# Patient Record
Sex: Female | Born: 1975 | Race: White | Hispanic: No | Smoking: Never smoker
Health system: Southern US, Community
[De-identification: ages and names within clinical notes are randomized; demographics above are authoritative.]

## PROBLEM LIST (undated history)

## (undated) DIAGNOSIS — F411 Generalized anxiety disorder: Secondary | ICD-10-CM

## (undated) DIAGNOSIS — G4700 Insomnia, unspecified: Secondary | ICD-10-CM

## (undated) DIAGNOSIS — F329 Major depressive disorder, single episode, unspecified: Secondary | ICD-10-CM

## (undated) DIAGNOSIS — J45909 Unspecified asthma, uncomplicated: Secondary | ICD-10-CM

## (undated) DIAGNOSIS — F3289 Other specified depressive episodes: Secondary | ICD-10-CM

## (undated) DIAGNOSIS — R87610 Atypical squamous cells of undetermined significance on cytologic smear of cervix (ASC-US): Secondary | ICD-10-CM

## (undated) DIAGNOSIS — J309 Allergic rhinitis, unspecified: Secondary | ICD-10-CM

## (undated) HISTORY — DX: Generalized anxiety disorder: F41.1

## (undated) HISTORY — PX: ENDOMETRIAL ABLATION: SHX621

## (undated) HISTORY — PX: TUBAL LIGATION: SHX77

## (undated) HISTORY — DX: Other specified depressive episodes: F32.89

## (undated) HISTORY — DX: Atypical squamous cells of undetermined significance on cytologic smear of cervix (ASC-US): R87.610

## (undated) HISTORY — DX: Major depressive disorder, single episode, unspecified: F32.9

## (undated) HISTORY — DX: Allergic rhinitis, unspecified: J30.9

## (undated) HISTORY — DX: Unspecified asthma, uncomplicated: J45.909

## (undated) HISTORY — DX: Insomnia, unspecified: G47.00

---

## 2005-06-08 ENCOUNTER — Encounter: Payer: Self-pay | Admitting: Family Medicine

## 2005-06-08 LAB — CONVERTED CEMR LAB: Pap Smear: NORMAL

## 2005-06-29 ENCOUNTER — Other Ambulatory Visit: Admission: RE | Admit: 2005-06-29 | Discharge: 2005-06-29 | Payer: Self-pay | Admitting: Family Medicine

## 2006-06-30 ENCOUNTER — Ambulatory Visit: Payer: Self-pay | Admitting: Family Medicine

## 2006-08-12 ENCOUNTER — Other Ambulatory Visit: Admission: RE | Admit: 2006-08-12 | Discharge: 2006-08-12 | Payer: Self-pay | Admitting: Family Medicine

## 2007-03-07 ENCOUNTER — Ambulatory Visit (HOSPITAL_COMMUNITY): Admission: RE | Admit: 2007-03-07 | Discharge: 2007-03-07 | Payer: Self-pay | Admitting: Orthopedic Surgery

## 2007-07-07 ENCOUNTER — Telehealth: Payer: Self-pay | Admitting: Family Medicine

## 2007-07-11 ENCOUNTER — Encounter: Payer: Self-pay | Admitting: Family Medicine

## 2007-07-11 DIAGNOSIS — F329 Major depressive disorder, single episode, unspecified: Secondary | ICD-10-CM

## 2007-07-11 DIAGNOSIS — G4700 Insomnia, unspecified: Secondary | ICD-10-CM

## 2007-07-11 DIAGNOSIS — J309 Allergic rhinitis, unspecified: Secondary | ICD-10-CM | POA: Insufficient documentation

## 2007-09-09 ENCOUNTER — Other Ambulatory Visit: Admission: RE | Admit: 2007-09-09 | Discharge: 2007-09-09 | Payer: Self-pay | Admitting: Family Medicine

## 2007-09-15 ENCOUNTER — Encounter: Admission: RE | Admit: 2007-09-15 | Discharge: 2007-09-15 | Payer: Self-pay | Admitting: Occupational Medicine

## 2007-12-13 ENCOUNTER — Ambulatory Visit: Payer: Self-pay | Admitting: Family Medicine

## 2007-12-13 DIAGNOSIS — J329 Chronic sinusitis, unspecified: Secondary | ICD-10-CM | POA: Insufficient documentation

## 2008-10-24 ENCOUNTER — Other Ambulatory Visit: Admission: RE | Admit: 2008-10-24 | Discharge: 2008-10-24 | Payer: Self-pay | Admitting: Family Medicine

## 2009-04-02 ENCOUNTER — Ambulatory Visit (HOSPITAL_COMMUNITY): Admission: RE | Admit: 2009-04-02 | Discharge: 2009-04-02 | Payer: Self-pay | Admitting: Family Medicine

## 2009-05-10 ENCOUNTER — Ambulatory Visit: Payer: Self-pay | Admitting: Family Medicine

## 2009-05-10 DIAGNOSIS — N390 Urinary tract infection, site not specified: Secondary | ICD-10-CM | POA: Insufficient documentation

## 2009-05-10 LAB — CONVERTED CEMR LAB
RBC / HPF: 0
Specific Gravity, Urine: 1.01
pH: 6

## 2009-05-11 ENCOUNTER — Encounter (INDEPENDENT_AMBULATORY_CARE_PROVIDER_SITE_OTHER): Payer: Self-pay | Admitting: Internal Medicine

## 2009-05-14 ENCOUNTER — Ambulatory Visit: Admission: RE | Admit: 2009-05-14 | Discharge: 2009-05-14 | Payer: Self-pay | Admitting: Gynecologic Oncology

## 2009-05-22 ENCOUNTER — Ambulatory Visit: Payer: Self-pay | Admitting: Family Medicine

## 2009-05-22 DIAGNOSIS — R300 Dysuria: Secondary | ICD-10-CM

## 2009-05-22 LAB — CONVERTED CEMR LAB
Bilirubin Urine: NEGATIVE
Blood in Urine, dipstick: NEGATIVE
Glucose, Urine, Semiquant: NEGATIVE
Ketones, urine, test strip: NEGATIVE
Nitrite: NEGATIVE
Protein, U semiquant: NEGATIVE
Specific Gravity, Urine: 1.01
Urobilinogen, UA: 0.2
WBC Urine, dipstick: NEGATIVE
pH: 7

## 2009-06-03 ENCOUNTER — Encounter: Payer: Self-pay | Admitting: Family Medicine

## 2009-06-10 ENCOUNTER — Encounter: Payer: Self-pay | Admitting: Family Medicine

## 2009-08-06 ENCOUNTER — Ambulatory Visit: Payer: Self-pay | Admitting: Family Medicine

## 2009-10-21 ENCOUNTER — Ambulatory Visit (HOSPITAL_COMMUNITY): Admission: RE | Admit: 2009-10-21 | Discharge: 2009-10-21 | Payer: Self-pay | Admitting: Obstetrics and Gynecology

## 2009-11-01 ENCOUNTER — Other Ambulatory Visit: Admission: RE | Admit: 2009-11-01 | Discharge: 2009-11-01 | Payer: Self-pay | Admitting: Family Medicine

## 2010-02-06 ENCOUNTER — Emergency Department (HOSPITAL_COMMUNITY): Admission: EM | Admit: 2010-02-06 | Discharge: 2010-02-06 | Payer: Self-pay | Admitting: Emergency Medicine

## 2010-07-08 NOTE — Assessment & Plan Note (Signed)
Summary: CHECK ON MED AND MAYBE CHANGING/DLO   Vital Signs:  Patient profile:   35 year old female Height:      68 inches Weight:      144.6 pounds BMI:     22.07 Temp:     98.1 degrees F oral Pulse rate:   76 / minute Pulse rhythm:   regular BP sitting:   110 / 70  (left arm) Cuff size:   regular  Vitals Entered By: Benny Lennert CMA Duncan Dull) (August 06, 2009 3:23 PM)  History of Present Illness: Chief complaint review medication  Urinary issues..felt to be due to endometriosis.Marland Kitchensees Dr. Vincente Poli.  HAs been on multiple OCP meds recently.  Depression/anxiety worse in last month Increase in worry. decrease in energy. Fairly good appeytitie. Increase in tearfulness, moody. Poor sleep..uses xanax to sleep. Thoughts race at night. No SI. On zoloft 50 mg x 4 years. Wishes to change given some asspciated weight gain.    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 1 Tablet By Mouth Once A Day 2)  Ambien Cr 12.5 Mg Tbcr (Zolpidem Tartrate) .... Take 1 Tablet By Mouth Every Night As Needed 3)  Albuterol 90 Mcg/act Aers (Albuterol) .... As Needed 4)  Xanax 0.5 Mg Tabs (Alprazolam) .... Take 1 Tablet By Mouth Three Times A Day As Needed 5)  Multivitamins   Tabs (Multiple Vitamin) .... One Tablet Daily 6)  Lybrel 90-20 Mcg Tabs (Levonorgestrel-Ethinyl Estrad) .... Once Daily  Allergies (verified): No Known Drug Allergies  Past History:  Past medical, surgical, family and social histories (including risk factors) reviewed, and no changes noted (except as noted  below).  Past Medical History: Reviewed history from 07/11/2007 and no changes required. PAP SMEAR, ABNORMAL, ASCUS - HIGH RISK HPV (ICD-795.01) INSOMNIA, CHRONIC (ICD-307.42) DEPRESSION (ICD-311) ASTHMA- EXERCISE INDUCED (ICD-493.90) ANXIETY - PANIC ATTACKS (ICD-300.00) ALLERGIC RHINITIS (ICD-477.9)    Family History: Reviewed history from 07/11/2007 and no changes required. Father: Alive 48 healthy Mother: Alive 68, HTN, high chol. Siblings: 1 sister, Crohn's (+) MI < 6  - MGF age 70 CA:  Ovarian PGM, Breast Maternal Aunt age 75, Lung PGF  Social History: Reviewed history from 07/11/2007 and no changes required. Never Smoked Alcohol use-no Drug use-no Regular exercise-yes, walks 4 x/week - 3-5 miles    10 K's Marital Status: Married 3 years, no domestic abuse Children: 1, healthy Occupation: Charity fundraiser (OR Engineer, civil (consulting)) [facility] Diet:  (+) fruits/veggies, no FF, (+) H2O  Physical Exam  General:  Well-developed,well-nourished,in no acute distress; alert,appropriate and cooperative throughout examination Mouth:  Oral mucosa and oropharynx without lesions or exudates.  Teeth in good repair. Neck:  no carotid bruit or thyromegaly no cervical or supraclavicular lymphadenopathy  Lungs:  Normal respiratory effort, chest expands symmetrically. Lungs are clear to auscultation, no crackles or wheezes. Heart:  Normal rate and regular rhythm. S1 and S2 normal without gallop, murmur, click, rub or other extra sounds. Abdomen:  Bowel sounds positive,abdomen soft and non-tender without masses, organomegaly or hernias noted. Pulses:  R and L posterior  tibial pulses are full and equal bilaterally  Extremities:  no edema  Psych:  Oriented X3, normally interactive, subdued, and slightly anxious.     Impression & Recommendations:  Problem # 1:  DEPRESSION (ICD-311) Poor control.. will change to wellbutrin given pt concern of weight issues.  Follow up in 1 month.  The following medications were  removed from the medication list:    Zoloft 50 Mg Tabs (Sertraline hcl) .Marland Kitchen... 1 tablet by mouth once a day Her updated medication list for this problem includes:    Xanax 0.5 Mg Tabs (Alprazolam) .Marland Kitchen... Take 1 tablet by mouth three times a day as needed    Bupropion Hcl 150 Mg Xr24h-tab (Bupropion hcl) .Marland Kitchen... 1 tab by mouth daily  Problem # 2:  ANXIETY - PANIC ATTACKS (ICD-300.00) Uses Xanax as needed..provided by GYN.  The following medications were removed from the medication list:    Zoloft 50 Mg Tabs (Sertraline hcl) .Marland Kitchen... 1 tablet by mouth once a day Her updated medication list for this problem includes:    Xanax 0.5 Mg Tabs (Alprazolam) .Marland Kitchen... Take 1 tablet by mouth three times a day as needed    Bupropion Hcl 150 Mg Xr24h-tab (Bupropion hcl) .Marland Kitchen... 1 tab by mouth daily  Complete Medication List: 1)  Ambien Cr 12.5 Mg Tbcr (Zolpidem tartrate) .... Take 1 tablet by mouth every night as needed 2)  Albuterol 90 Mcg/act Aers (Albuterol) .... As needed 3)  Xanax 0.5 Mg Tabs (Alprazolam) .... Take 1 tablet by mouth three times a day as needed 4)  Multivitamins Tabs (Multiple vitamin) .... One tablet daily 5)  Lybrel 90-20 Mcg Tabs (Levonorgestrel-ethinyl estrad) .... Once daily 6)  Bupropion Hcl 150 Mg Xr24h-tab (Bupropion hcl) .Marland Kitchen.. 1 tab by mouth daily  Patient Instructions: 1)  Follow up in 1 month for depression. Prescriptions: BUPROPION HCL 150 MG XR24H-TAB (BUPROPION HCL) 1 tab by mouth daily  #30 x 11   Entered and Authorized by:   Kerby Nora MD   Signed by:   Kerby Nora MD on 08/06/2009   Method used:   [facility]        [facility]  [address] (retail)       [address]       [place], [place]  [address]       Ph: [phone] or [phone]       Fax: [phone]   RxID:   [id number]   Current Allergies (reviewed today): No known allergies

## 2010-07-08 NOTE — Letter (Signed)
Summary: [facility]   Imported By: Lanelle Bal 06/24/2009 08:54:06  _____________________________________________________________________  External Attachment:    Type:   Image     Comment:   External Document

## 2010-07-08 NOTE — Consult Note (Signed)
Summary: [facility]  [facility]   Imported By: Lanelle Bal 06/24/2009 08:51:30  _____________________________________________________________________  External Attachment:    Type:   Image     Comment:   External Document

## 2010-07-31 ENCOUNTER — Encounter: Payer: Self-pay | Admitting: Family Medicine

## 2010-07-31 ENCOUNTER — Ambulatory Visit (INDEPENDENT_AMBULATORY_CARE_PROVIDER_SITE_OTHER): Payer: Managed Care, Other (non HMO) | Admitting: Family Medicine

## 2010-07-31 DIAGNOSIS — G43009 Migraine without aura, not intractable, without status migrainosus: Secondary | ICD-10-CM

## 2010-08-05 NOTE — Assessment & Plan Note (Signed)
Summary: [id number]   Vital Signs:  Patient profile:   35 year old female Weight:      147.50 pounds BMI:     22.51 Temp:     99.0 degrees F oral Pulse rate:   80 / minute Pulse rhythm:   regular BP sitting:   110 / 78  (left arm) Cuff size:   regular  Vitals Entered By: Selena Batten Dance CMA Duncan Dull) (July 31, 2010 9:31 AM) CC: Migraine   History of Present Illness: CC: migraines  migraines on and off for years.  h/o HA and migraines.  currently with just HA but yesterday had bad migraine with vision changes.  Getting 2-3 migraines every week.  Associated with nausea, photo/phonophobia.  Does get blurry vision and "irritabiliy" aura.  Pain starts in back of neck, then behind both eyes, bilateral.    Not associated with cycle.  h/o endometrial ablation 10/2009, no period since.  h/o BTL.  years ago took imitrex which worked well but had side effects - felt like on fire.  Has been taking too many BC/Excedrin daily.  h/o GAD and OCD, takes zoloft.  family member and coworker have had good success with topamax  Current Medications (verified): 1)  Albuterol 90 Mcg/act Aers (Albuterol) .... As Needed 2)  Xanax 0.5 Mg Tabs (Alprazolam) .... Take 1 Tablet By Mouth Three Times A Day As Needed 3)  Multivitamins   Tabs (Multiple Vitamin) .... One Tablet Daily 4)  Zoloft 100 Mg Tabs (Sertraline Hcl) .Marland Kitchen.. 1 By Mouth Once Daily  Allergies (verified): No Known Drug Allergies  Past History:  Past Medical History: Last updated: 07/11/2007 PAP SMEAR, ABNORMAL, ASCUS - HIGH RISK HPV (ICD-795.01) INSOMNIA, CHRONIC (ICD-307.42) DEPRESSION (ICD-311) ASTHMA- EXERCISE INDUCED (ICD-493.90) ANXIETY - PANIC ATTACKS (ICD-300.00) ALLERGIC RHINITIS (ICD-477.9)    Social History: Never Smoked, no alcohol, no rec drugs caffeine: 1/2 cup/day. Regular exercise-yes, walks 4 x/week - 3-5 miles    10 K's Marital Status: Married 3 years, no domestic abuse Children: 1, healthy Occupation: Charity fundraiser (OR Engineer, civil (consulting))  [facility] [facility] Diet:  (+) fruits/veggies, no FF, (+) H2O  Review of Systems       per HPI  Physical Exam  General:  Well-developed,well-nourished,in no acute distress; alert,appropriate and cooperative throughout examination Head:  Normocephalic and atraumatic without obvious abnormalities. No apparent alopecia or balding. Eyes:  No corneal or conjunctival inflammation noted. EOMI. Perrla.  Ears:  Tms clear  Nose:  nares clear Mouth:  Oral mucosa and oropharynx without lesions or exudates.  Teeth in good repair. Neck:  no carotid bruit or thyromegaly no cervical or supraclavicular lymphadenopathy  Neurologic:  CN 2-12 intact, station and gait intact, sensation intact, strength intact.   Impression & Recommendations:  Problem # 1:  COMMON MIGRAINE (ICD-346.10) neuro nonfocal today.  consistent with migraines discussed treatment of migraines. stop all analgesics, only use excedrin at work if migraine, flexeril at home. Start topamax ppx medication.  discussed side effects including word finding difficutlies, tingling in fingers and dizzienss. imitrex worked but intolerable side effects (body on fire and tinglig).  consider nasal triptan in future keep HA diary, return in 1 mo for f/u.  Complete Medication List: 1)  Albuterol 90 Mcg/act Aers (Albuterol) .... As needed 2)  Xanax 0.5 Mg Tabs (Alprazolam) .... Take 1 tablet by mouth three times a day as needed 3)  Multivitamins Tabs (Multiple vitamin) .... One tablet daily 4)  Zoloft 100 Mg Tabs (Sertraline hcl) .Marland Kitchen.. 1 by mouth once daily 5)  Topamax 50 Mg Tabs (Topiramate) .... One nightly for migraine prevention 6)  Flexeril 5 Mg Tabs (Cyclobenzaprine hcl) .... Take one by mouth two times a day as needed headache, sedation precautions  Patient Instructions: 1)  return to see Dr. Ermalene Searing or myself in 1 month. 2)  Keep headache diary to see if any specific triggers for migraines 3)  Stop all BC and excedrin for now (medication overuse  headache).  May take excedrin at work, but use flexeril at home.  Take at onset of headache. 4)  Start topamax nightly for headache prevention. Prescriptions: FLEXERIL 5 MG TABS (CYCLOBENZAPRINE HCL) take one by mouth two times a day as needed headache, sedation precautions  #30 x 0   Entered and Authorized by:   Eustaquio Boyden  MD   Signed by:   Eustaquio Boyden  MD on 07/31/2010   Method used:   [facility]        [facility]  [address] [facility]       [address]       [place], [place]  [address]       Ph: [phone] or [phone]       Fax: [phone]   RxID:   [id number] TOPAMAX 50 MG TABS (TOPIRAMATE) one nightly for migraine prevention  #30 x 0   Entered and Authorized by:   Eustaquio Boyden  MD   Signed by:   Eustaquio Boyden  MD on 07/31/2010   Method used:   [facility]        [facility]  [id number]       [address]       [place], [place]  [address]       Ph: [phone] or [phone]       Fax: [phone]   RxID:   [id number]    Orders Added: 1)  Est. Patient Level III [20254]    Current Allergies (reviewed today): No known allergies

## 2010-08-25 ENCOUNTER — Encounter: Payer: Self-pay | Admitting: Family Medicine

## 2010-08-26 ENCOUNTER — Ambulatory Visit (INDEPENDENT_AMBULATORY_CARE_PROVIDER_SITE_OTHER): Payer: Managed Care, Other (non HMO) | Admitting: Family Medicine

## 2010-08-26 ENCOUNTER — Encounter: Payer: Self-pay | Admitting: Family Medicine

## 2010-08-26 VITALS — BP 118/70 | HR 76 | Temp 98.6°F | Wt 146.0 lb

## 2010-08-26 DIAGNOSIS — G43009 Migraine without aura, not intractable, without status migrainosus: Secondary | ICD-10-CM

## 2010-08-26 LAB — CBC
HCT: 41.3 % (ref 36.0–46.0)
Hemoglobin: 14.4 g/dL (ref 12.0–15.0)
MCHC: 34.9 g/dL (ref 30.0–36.0)
MCV: 97.7 fL (ref 78.0–100.0)
RBC: 4.23 MIL/uL (ref 3.87–5.11)

## 2010-08-26 LAB — PREGNANCY, URINE: Preg Test, Ur: NEGATIVE

## 2010-08-26 MED ORDER — TOPIRAMATE 50 MG PO TABS: 50.0000 mg | ORAL_TABLET | Freq: Every day | ORAL | Status: DC

## 2010-08-26 MED ORDER — CYCLOBENZAPRINE HCL 5 MG PO TABS: 5.0000 mg | ORAL_TABLET | Freq: Two times a day (BID) | ORAL | Status: DC | PRN

## 2010-08-26 NOTE — Progress Notes (Signed)
  Subjective:    [name] [name] is a 35 y.o. female who presents for follow-up of migraine headaches. Home treatment has included Topamax 50mg  daily with 75% improvement. Headaches are occurring once per week. Generally, the headaches last about 1 hour. Work attendance or other daily activities are affected by the headaches. The patient denies decreased physical activity, dizziness, loss of balance, speech difficulties, vision problems and vomiting in the early morning.  Kept headache diary, has had 3 migraines.  (2/22, 3/1, 3/10).  First one with no sleep (on call).  Other two occurred after pt was in meetings all day.  One of those days had to go home, took flexeril and went to sleep.  Stopped cold [place] analgesics.  No dizziness, paresthesias.  Notes occasional word finding difficulty.  The following portions of the patient's history were reviewed and updated as appropriate: allergies, current medications, past medical history, past social history, past surgical history and problem list.  Review of Systems Pertinent items are noted in HPI.    Objective:    BP 118/70  Pulse 76  Temp(Src) 98.6 F (37 C) (Oral)  Wt 146 lb (66.225 kg) General appearance: alert and cooperative Head: Normocephalic, without obvious abnormality, atraumatic Eyes: conjunctivae/corneas clear. PERRL, EOM's intact. Fundi benign. Throat: lips, mucosa, and tongue normal; teeth and gums normal Neck: no adenopathy and supple, symmetrical, trachea midline Lungs: clear to auscultation bilaterally Heart: regular rate and rhythm, S1, S2 normal, no murmur, click, rub or gallop    Assessment:    Common migraine    Plan:    Continue present treatment and plan. Episodic therapy: flexeril due to low frequency of pain. Prophylactic therapy: Topamax due to high frequency of pain. Side effect profile discussed in detail.  F/u 1-2 months for f/u.  topamax refilled as well as flexeril. S/p BTL

## 2010-08-26 NOTE — Progress Notes (Signed)
Addended by: Eustaquio Boyden on: 08/26/2010 04:49 PM   Modules accepted: Orders, Medications

## 2010-08-26 NOTE — Patient Instructions (Signed)
Good to see you today. Return in 1-2 months for follow up migraines. Continue topamax, flexeril.  (refilled today.) Ensure getting enough rest to further prevent migraines.

## 2010-09-09 LAB — HEPATITIS C ANTIBODY: HCV Ab: NEGATIVE

## 2010-09-09 LAB — GC/CHLAMYDIA PROBE AMP, GENITAL: Chlamydia, DNA Probe: NEGATIVE

## 2010-09-09 LAB — RPR: RPR Ser Ql: NONREACTIVE

## 2010-10-24 NOTE — Assessment & Plan Note (Signed)
[facility]                           [facility] OFFICE NOTE   NAME:[name], [name]                         MRN:          [id number]  DATE:06/30/2006                            DOB:          01-22-1976    CHIEF COMPLAINT:  A 35 year old white female here to establish new  doctor   HISTORY OF PRESENT ILLNESS:  [name] [name] is here to set up with a primary  doctor since moving to [facility]. She was previously cared for by Dr.  Hyacinth Meeker at [facility].   She has the following concerns:  Depression and anxiety: She states that this has been fairly well-  controlled on Zoloft for the past few years. She states that she started  having problems in nursing school, but then her anxiety and panic  attacks worsened after having children. She states that she does not  have any side effects from the Zoloft 50 mg daily. She uses Xanax about  1 time per week for acute panic attacks. She uses Ambien for insomnia 2-  3 times per week. She has been seen at [facility]  previously, about 6 months ago, and states that it did not help much.   REVIEW OF SYSTEMS:  No headache. No dizziness. No hearing problems. No  vision problems. No chest pain or shortness of breath. No nausea,  vomiting or diarrhea, constipation or rectal bleeding. Cold intolerance,  fatigue. No coarse hair. No dry skin.   PAST MEDICAL HISTORY:  1. In 2002, depression.  2. Anxiety and panic attacks.  3. Chronic insomnia.  4. Allergic rhinitis.  5. Exercise-induced asthma.   HOSPITALIZATIONS, SURGERIES, AND PROCEDURES:  Pap smear negative in  January 2007.   ALLERGIES:  None.   MEDICATIONS:  1. Zoloft 50 mg daily.  2. Ambien CR 12.5 mg nightly p.r.n.  3. Xanax 0.5 mg daily p.r.n.  4. Albuterol MDI p.r.n.  5. Multivitamin daily.   SOCIAL HISTORY:  No tobacco use. No alcohol use. No drug use. She works  as an Scientist, forensic at [employer]. She has been married for three  years. She  denies domestic abuse. She has 1 child who is healthy. She runs 4 times  per week about 3 to 5 miles. She and her husband are planning to work up  to doing 10 K's. They currently do 5 K's. She tries to eat fruit and  vegetables, but does not eat much carbohydrates. She avoids fast foods.  She drinks lots of water.   FAMILY HISTORY:  Father alive at age 67, healthy. Mother alive at age 57  with hypertension and high cholesterol. She has a strong family history  of heart attack in her maternal grandfather who had one at age 75. She  has one sister who has Crohn's disease. There is no family history of  diabetes. She has paternal grandmother with ovarian cancer and maternal  aunt who developed breast cancer at age 15. Paternal grandfather with  lung cancer.   PHYSICAL EXAMINATION:  VITAL SIGNS: Height 57 inches, weight 140.2,  making BMI approximately  21. Blood pressure 104/60. Pulse 80.  Temperature 98.0.  GENERAL: Thin-appearing female in no apparent distress.  HEENT: PERRLA. Extra-ocular muscles intact. Oropharynx clear. Tympanic  membranes clear. Nares clear. No thyromegaly. No lymphadenopathy-  supraclavicular or cervical.  CARDIOVASCULAR: Regular rate and rhythm. No murmurs, rubs or gallops.  Normal PMI, 2+ peripheral pulses. No peripheral edema.  LUNGS: Clear to auscultation bilaterally. No wheezes, rales or rhonchi.  ABDOMEN: Soft and nontender. Normoactive bowel sounds. No  hepatosplenomegaly.  MUSCULOSKELETAL: Strength 5/5 in upper and lower extremities.  NEURO: Alert and oriented x3. Cranial nerves II-XII grossly intact.  PSYCH:  No suicidal or homicidal ideation. Appropriate affect. No  hallucinations reported.   ASSESSMENT/PLAN:  1. Depression and anxiety:  I refilled [name] [name]'s Zoloft 50 mg daily      with 6 refills. I will obtain records to verify there is no      problems with her using Xanax or Ambien in the past. We did discuss      these are not great  medications to use chronically. We can always      consider adjusting medication or referral to psychiatry if she      finds that she is using Xanax more frequently.  2. Prevention: She is due for a pap smear and will schedule this. She      is also due for a tetanus vaccine and we will consider doing this      at our next visit. She was congratulated on her frequent exercise.      She was encouraged to make sure she gets a variety of foods in her      diet. She has never had a cholesterol baseline and given her family      history of heart disease we can consider getting one. In addition,      she has some symptoms that could be associated with low thyroid      including depression, fatigue and cold intolerance. We can consider      testing this with her next labs.     Kerby Nora, MD  Electronically Signed    AB/MedQ  DD: 06/30/2006  DT: 06/30/2006  Job #: [id number]

## 2010-10-27 ENCOUNTER — Encounter: Payer: Self-pay | Admitting: Family Medicine

## 2010-10-27 ENCOUNTER — Ambulatory Visit (INDEPENDENT_AMBULATORY_CARE_PROVIDER_SITE_OTHER): Payer: Managed Care, Other (non HMO) | Admitting: Family Medicine

## 2010-10-27 VITALS — BP 120/80 | HR 76 | Temp 98.2°F | Wt 143.8 lb

## 2010-10-27 DIAGNOSIS — J329 Chronic sinusitis, unspecified: Secondary | ICD-10-CM

## 2010-10-27 MED ORDER — AZITHROMYCIN 250 MG PO TABS: ORAL_TABLET | ORAL | Status: AC

## 2010-10-27 NOTE — Progress Notes (Signed)
SUBJECTIVE:  [name] [name] is a 35 y.o. female who complains of coryza, sore throat, swollen glands, post nasal drip, dry cough and fever for 7 days. She denies a history of chest pain, chills, dizziness and vomiting and denies a history of asthma. Patient does not smoke cigarettes.  Symptoms had improved, started taking amoxicillin she had at home 3 days ago.  Last night, symptoms abruptly became worse, spiked temp to 101.   OBJECTIVE: BP 120/80  Pulse 76  Temp(Src) 98.2 F (36.8 C) (Oral)  Wt 143 lb 12.8 oz (65.227 kg)  She appears well, vital signs are as noted. Ears normal.  Throat and pharynx normal.  Neck supple. No adenopathy in the neck. Nose is congested. Sinuses non tender. The chest is clear, without wheezes or rales.  ASSESSMENT:  viral upper respiratory illness and sinusitis  PLAN: Symptomatic therapy suggested: push fluids, rest, gargle warm salt water, return office visit prn if symptoms persist or worsen. Likely viral but given spiked temp on amoxicillin, will treat with Zpack.   Call or return to clinic prn if these symptoms worsen or fail to improve as anticipated.

## 2010-11-19 ENCOUNTER — Other Ambulatory Visit: Payer: Self-pay | Admitting: Family Medicine

## 2010-11-19 ENCOUNTER — Other Ambulatory Visit (HOSPITAL_COMMUNITY)
Admission: RE | Admit: 2010-11-19 | Discharge: 2010-11-19 | Disposition: A | Discharge status: Home / Self Care | Payer: Managed Care, Other (non HMO) | Source: Ambulatory Visit | Attending: Family Medicine | Admitting: Family Medicine

## 2010-11-19 DIAGNOSIS — Z124 Encounter for screening for malignant neoplasm of cervix: Secondary | ICD-10-CM | POA: Insufficient documentation

## 2010-11-19 DIAGNOSIS — R87810 Cervical high risk human papillomavirus (HPV) DNA test positive: Secondary | ICD-10-CM | POA: Insufficient documentation

## 2011-03-27 ENCOUNTER — Encounter (INDEPENDENT_AMBULATORY_CARE_PROVIDER_SITE_OTHER): Payer: Self-pay | Admitting: Surgery

## 2011-03-27 NOTE — Progress Notes (Signed)
This encounter was created in error - please disregard.

## 2011-05-05 ENCOUNTER — Other Ambulatory Visit: Payer: Self-pay | Admitting: Family Medicine

## 2011-05-05 NOTE — Telephone Encounter (Signed)
Ok to refill 

## 2011-11-26 ENCOUNTER — Other Ambulatory Visit: Payer: Self-pay | Admitting: Family Medicine

## 2011-11-26 ENCOUNTER — Other Ambulatory Visit (HOSPITAL_COMMUNITY)
Admission: RE | Admit: 2011-11-26 | Discharge: 2011-11-26 | Disposition: A | Discharge status: Home / Self Care | Payer: Managed Care, Other (non HMO) | Source: Ambulatory Visit | Attending: Family Medicine | Admitting: Family Medicine

## 2011-11-26 DIAGNOSIS — R87810 Cervical high risk human papillomavirus (HPV) DNA test positive: Secondary | ICD-10-CM | POA: Insufficient documentation

## 2011-11-26 DIAGNOSIS — Z124 Encounter for screening for malignant neoplasm of cervix: Secondary | ICD-10-CM | POA: Insufficient documentation

## 2011-12-09 ENCOUNTER — Encounter: Payer: Self-pay | Admitting: Family Medicine

## 2011-12-09 ENCOUNTER — Ambulatory Visit (INDEPENDENT_AMBULATORY_CARE_PROVIDER_SITE_OTHER): Payer: Managed Care, Other (non HMO) | Admitting: Family Medicine

## 2011-12-09 VITALS — BP 120/70 | HR 83 | Temp 98.4°F | Ht 68.0 in | Wt 149.8 lb

## 2011-12-09 DIAGNOSIS — R21 Rash and other nonspecific skin eruption: Secondary | ICD-10-CM

## 2011-12-09 MED ORDER — PREDNISONE 10 MG PO TABS: ORAL_TABLET | ORAL | Status: DC

## 2011-12-09 NOTE — Patient Instructions (Addendum)
Complete course of steroids. Call if rash not improving or changing.  If see large lesion appear (herald patch) .Marland Kitchen Consider Pitaryasis rosea as diagnosis, but no specific treatment. Can take 4-6 weeks to resolve.

## 2011-12-09 NOTE — Progress Notes (Signed)
  Subjective:    Patient ID: [name] [name], female    DOB: 02/04/1976, 36 y.o.   MRN: [id number]  Rash This is a new problem. The current episode started in the past 7 days (after returning from pool.). The affected locations include the abdomen, right axilla, left axilla and torso. The rash is characterized by redness (not itchy). Associated with: went to pool, worse swimming suit, new, used old sunscreen. Pertinent negatives include no anorexia, facial edema, fatigue, fever, joint pain, nail changes, shortness of breath or vomiting. Past treatments include anti-itch cream and topical steroids. The treatment provided no relief. There is no history of allergies, asthma, eczema or varicella.     Review of Systems  Constitutional: Negative for fever and fatigue.  Respiratory: Negative for shortness of breath.   Gastrointestinal: Negative for vomiting and anorexia.  Musculoskeletal: Negative for joint pain.  Skin: Positive for rash. Negative for nail changes.       Objective:   Physical Exam  Constitutional: She appears well-developed and well-nourished.  Neck: Normal range of motion. Neck supple.  Cardiovascular: Normal rate, regular rhythm, normal heart sounds and intact distal pulses.   No murmur heard. Pulmonary/Chest: Effort normal. No respiratory distress. She has no wheezes. She has no rales. She exhibits no tenderness.  Abdominal: Soft. Bowel sounds are normal.  Skin: Skin is warm and dry. Rash noted. Rash is urticarial.          Erythematous raise macule across noted distribution. No oral or hand/foot lesions          Assessment & Plan:

## 2011-12-09 NOTE — Assessment & Plan Note (Signed)
Appears most consistent with irritant dermatitis/hives. Not clearly pitaryasis rosae.. No herald lesion etc. Did discuss this possibliity with pt. Will treat with course of low dose steroid given large distribution difficult to treat with topical. Call if not improving as expected.

## 2011-12-16 ENCOUNTER — Other Ambulatory Visit: Payer: Self-pay | Admitting: Family Medicine

## 2012-04-14 ENCOUNTER — Other Ambulatory Visit: Payer: Self-pay | Admitting: Family Medicine

## 2012-04-14 ENCOUNTER — Other Ambulatory Visit (HOSPITAL_COMMUNITY)
Admission: RE | Admit: 2012-04-14 | Discharge: 2012-04-14 | Disposition: A | Discharge status: Home / Self Care | Payer: Managed Care, Other (non HMO) | Source: Ambulatory Visit | Attending: Family Medicine | Admitting: Family Medicine

## 2012-04-14 DIAGNOSIS — Z124 Encounter for screening for malignant neoplasm of cervix: Secondary | ICD-10-CM | POA: Insufficient documentation

## 2012-09-19 ENCOUNTER — Encounter (HOSPITAL_COMMUNITY): Payer: Self-pay | Admitting: Pharmacy Technician

## 2012-09-28 ENCOUNTER — Encounter (HOSPITAL_COMMUNITY): Payer: Self-pay

## 2012-09-28 ENCOUNTER — Encounter (HOSPITAL_COMMUNITY)
Admission: RE | Admit: 2012-09-28 | Discharge: 2012-09-28 | Disposition: A | Discharge status: Home / Self Care | Payer: Managed Care, Other (non HMO) | Source: Ambulatory Visit | Attending: Obstetrics and Gynecology | Admitting: Obstetrics and Gynecology

## 2012-09-28 LAB — SURGICAL PCR SCREEN: MRSA, PCR: NEGATIVE

## 2012-09-28 LAB — CBC
Hemoglobin: 14.2 g/dL (ref 12.0–15.0)
MCH: 32.1 pg (ref 26.0–34.0)
RBC: 4.43 MIL/uL (ref 3.87–5.11)
WBC: 5.4 10*3/uL (ref 4.0–10.5)

## 2012-09-28 NOTE — Patient Instructions (Addendum)
Your procedure is scheduled on:10/03/12  Enter through the Main Entrance at :6am Pick up desk phone and dial [phone] and inform us of your arrival.  Please call [phone] if you have any problems the morning of surgery.  Remember: Do not eat or drink after midnight:SUNDAY   Take these meds the morning of surgery with a sip of water: Sertraline  DO NOT wear jewelry, eye make-up, lipstick,body lotion, or dark fingernail polish. Do not shave for 48 hours prior to surgery.  If you are to be admitted after surgery, leave suitcase in car until your room has been assigned. Patients discharged on the day of surgery will not be allowed to drive home.

## 2012-10-01 NOTE — H&P (Signed)
[name] [name] is an 37 y.o. female.G2P2 who presents for schedued LAVH/bilateral salpingectomy for ongoing issues with pelvic pain and dysmenorrhea.  The patient has had a long history of pelvic pain and dysmenorrhea with intermittent menorrhagia.  She underwent an endometrial ablation in 2011 and got some relief from her symptoms until about 6 months ago when she began to have cyclical pain again and intermittent bleeding She wants to proceed with definitive surgical therapy at this time.  She has a remote h/o abnormal paps but these have normalized now.  She is s/p BTL and has no further desire for childbearing.    OB History:  NSVD x 2  Menstrual History:  No LMP recorded. Patient is not currently having periods (Reason: Other).    Past Medical History  Diagnosis Date  . Papanicolaou smear of cervix with atypical squamous cells of undetermined significance (ASC-US)   . Persistent disorder of initiating or maintaining sleep   . Depressive disorder, not elsewhere classified   . Unspecified asthma   . Anxiety state, unspecified   . Allergic rhinitis, cause unspecified   . Migraine     Past Surgical History  Procedure Laterality Date  . Tubal ligation    . Endometrial ablation      Family History  Problem Relation Age of Onset  . Hypertension Mother   . Hyperlipidemia Mother   . Crohn's disease Sister   . Heart attack Maternal Grandfather 40  . Cancer Paternal Grandmother     ovarian  . Cancer Maternal Aunt 50    breast  . Cancer Paternal Grandfather     lung    Social History:  reports that she has never smoked. She does not have any smokeless tobacco history on file. She reports that  drinks alcohol. She reports that she does not use illicit drugs.  Allergies:  Allergies  Allergen Reactions  . Dilaudid (Hydromorphone Hcl) Itching    No prescriptions prior to admission    ROS  There were no vitals taken for this visit. Physical Exam  Constitutional: She is  oriented to person, place, and time. She appears well-developed and well-nourished.  Cardiovascular: Normal rate and regular rhythm.   Respiratory: Effort normal and breath sounds normal.  GI: Soft.  Genitourinary: Vagina normal.  Uterus normal size Adnexa without masses  Musculoskeletal: Normal range of motion.  Neurological: She is alert and oriented to person, place, and time.  Psychiatric: She has a normal mood and affect. Her behavior is normal.    No results found for this or any previous visit (from the past 24 hour(s)).  No results found.  Assessment/Plan: The patient was counseled regarding the risks of laparoscopic assisted vaginal hysterectomy, The procedure was reviewed in detail and expectations regarding recovery. Risks of bleeding, infection and possible damage to bowel and bladder were reviewed. The patient understands that should a complication arise she would likely need a larger abdominal incision and that this would delay her recovery. She would accept a blood transfusion if needed. We also discussed removal of the fallopian tubes as a means of possibly reducing future risk of ovarian cancer and she is agreeable to this. She will retain her ovaries unless there is obvious pathology the would warrant removal. She is ready to proceed   [name] [name] 10/01/2012, 8:43 PM

## 2012-10-02 ENCOUNTER — Encounter (HOSPITAL_COMMUNITY): Payer: Self-pay | Admitting: Anesthesiology

## 2012-10-02 NOTE — Anesthesia Preprocedure Evaluation (Addendum)
Anesthesia Evaluation  Patient identified by MRN, date of birth, ID band Patient awake    Reviewed: Allergy & Precautions, H&P , NPO status , Patient's Chart, lab work & pertinent test results  Airway Mallampati: II TM Distance: >3 FB     Dental no notable dental hx. (+) Teeth Intact   Pulmonary asthma ,  breath sounds clear to auscultation  Pulmonary exam normal       Cardiovascular negative cardio ROS  Rhythm:Regular Rate:Normal     Neuro/Psych  Headaches, PSYCHIATRIC DISORDERS Anxiety Depression    GI/Hepatic negative GI ROS, Neg liver ROS,   Endo/Other  negative endocrine ROS  Renal/GU negative Renal ROS   Dysuria    Musculoskeletal negative musculoskeletal ROS (+)   Abdominal   Peds  Hematology negative hematology ROS (+)   Anesthesia Other Findings   Reproductive/Obstetrics Menorrhagia                          Anesthesia Physical Anesthesia Plan  ASA: II  Anesthesia Plan: General   Post-op Pain Management:    Induction: Intravenous  Airway Management Planned: Oral ETT  Additional Equipment:   Intra-op Plan:   Post-operative Plan: Extubation in OR  Informed Consent: I have reviewed the patients History and Physical, chart, labs and discussed the procedure including the risks, benefits and alternatives for the proposed anesthesia with the patient or authorized representative who has indicated his/her understanding and acceptance.   Dental advisory given  Plan Discussed with: CRNA, Anesthesiologist and Surgeon  Anesthesia Plan Comments:         Anesthesia Quick Evaluation

## 2012-10-03 ENCOUNTER — Ambulatory Visit (HOSPITAL_COMMUNITY): Payer: Managed Care, Other (non HMO) | Admitting: Anesthesiology

## 2012-10-03 ENCOUNTER — Encounter (HOSPITAL_COMMUNITY)
Admission: RE | Disposition: A | Discharge status: Home / Self Care | Payer: Self-pay | Source: Ambulatory Visit | Attending: Obstetrics and Gynecology

## 2012-10-03 ENCOUNTER — Encounter (HOSPITAL_COMMUNITY): Payer: Self-pay

## 2012-10-03 ENCOUNTER — Ambulatory Visit (HOSPITAL_COMMUNITY)
Admission: RE | Admit: 2012-10-03 | Discharge: 2012-10-04 | Disposition: A | Discharge status: Home / Self Care | Payer: Managed Care, Other (non HMO) | Source: Ambulatory Visit | Attending: Obstetrics and Gynecology | Admitting: Obstetrics and Gynecology

## 2012-10-03 ENCOUNTER — Encounter (HOSPITAL_COMMUNITY): Payer: Self-pay | Admitting: Anesthesiology

## 2012-10-03 DIAGNOSIS — N949 Unspecified condition associated with female genital organs and menstrual cycle: Secondary | ICD-10-CM | POA: Insufficient documentation

## 2012-10-03 DIAGNOSIS — N838 Other noninflammatory disorders of ovary, fallopian tube and broad ligament: Secondary | ICD-10-CM | POA: Insufficient documentation

## 2012-10-03 DIAGNOSIS — N831 Corpus luteum cyst of ovary, unspecified side: Secondary | ICD-10-CM | POA: Insufficient documentation

## 2012-10-03 DIAGNOSIS — N920 Excessive and frequent menstruation with regular cycle: Secondary | ICD-10-CM | POA: Insufficient documentation

## 2012-10-03 DIAGNOSIS — Z90710 Acquired absence of both cervix and uterus: Secondary | ICD-10-CM | POA: Diagnosis not present

## 2012-10-03 DIAGNOSIS — N946 Dysmenorrhea, unspecified: Secondary | ICD-10-CM | POA: Insufficient documentation

## 2012-10-03 HISTORY — PX: BILATERAL SALPINGECTOMY: SHX5743

## 2012-10-03 HISTORY — PX: LAPAROSCOPIC ASSISTED VAGINAL HYSTERECTOMY: SHX5398

## 2012-10-03 LAB — TYPE AND SCREEN
ABO/RH(D): O POS
Antibody Screen: NEGATIVE

## 2012-10-03 SURGERY — HYSTERECTOMY, VAGINAL, LAPAROSCOPY-ASSISTED
Anesthesia: General | Site: Abdomen | Laterality: Bilateral | Wound class: Clean Contaminated

## 2012-10-03 MED ORDER — CEFAZOLIN SODIUM-DEXTROSE 2-3 GM-% IV SOLR
2.0000 g | INTRAVENOUS | Status: AC
  Administered 2012-10-03: 2 g via INTRAVENOUS

## 2012-10-03 MED ORDER — PHENYLEPHRINE 40 MCG/ML (10ML) SYRINGE FOR IV PUSH (FOR BLOOD PRESSURE SUPPORT)
PREFILLED_SYRINGE | INTRAVENOUS | Status: AC
  Filled 2012-10-03: qty 5

## 2012-10-03 MED ORDER — BUPIVACAINE HCL (PF) 0.25 % IJ SOLN
INTRAMUSCULAR | Status: AC
  Filled 2012-10-03: qty 30

## 2012-10-03 MED ORDER — ONDANSETRON HCL 4 MG/2ML IJ SOLN
INTRAMUSCULAR | Status: DC | PRN
  Administered 2012-10-03: 4 mg via INTRAVENOUS

## 2012-10-03 MED ORDER — VASOPRESSIN 20 UNIT/ML IJ SOLN
INTRAMUSCULAR | Status: AC
  Filled 2012-10-03: qty 1

## 2012-10-03 MED ORDER — GLYCOPYRROLATE 0.2 MG/ML IJ SOLN
INTRAMUSCULAR | Status: AC
  Filled 2012-10-03: qty 3

## 2012-10-03 MED ORDER — MIDAZOLAM HCL 2 MG/2ML IJ SOLN
INTRAMUSCULAR | Status: AC
  Filled 2012-10-03: qty 2

## 2012-10-03 MED ORDER — ZOLPIDEM TARTRATE 5 MG PO TABS
5.0000 mg | ORAL_TABLET | Freq: Every evening | ORAL | Status: DC | PRN
  Administered 2012-10-03: 5 mg via ORAL
  Filled 2012-10-03: qty 1

## 2012-10-03 MED ORDER — MEPERIDINE HCL 25 MG/ML IJ SOLN: 6.2500 mg | INTRAMUSCULAR | Status: DC | PRN

## 2012-10-03 MED ORDER — LACTATED RINGERS IR SOLN
Status: DC | PRN
  Administered 2012-10-03: 3000 mL

## 2012-10-03 MED ORDER — ONDANSETRON HCL 4 MG PO TABS: 4.0000 mg | ORAL_TABLET | Freq: Four times a day (QID) | ORAL | Status: DC | PRN

## 2012-10-03 MED ORDER — IBUPROFEN 600 MG PO TABS
600.0000 mg | ORAL_TABLET | Freq: Four times a day (QID) | ORAL | Status: DC | PRN
  Administered 2012-10-03 – 2012-10-04 (×2): 600 mg via ORAL
  Filled 2012-10-03 (×2): qty 1

## 2012-10-03 MED ORDER — CEFAZOLIN SODIUM-DEXTROSE 2-3 GM-% IV SOLR
INTRAVENOUS | Status: AC
  Filled 2012-10-03: qty 50

## 2012-10-03 MED ORDER — BUPIVACAINE HCL (PF) 0.25 % IJ SOLN
INTRAMUSCULAR | Status: DC | PRN
  Administered 2012-10-03: 12 mL

## 2012-10-03 MED ORDER — DEXTROSE-NACL 5-0.45 % IV SOLN
INTRAVENOUS | Status: DC
  Administered 2012-10-03: 10:00:00 via INTRAVENOUS

## 2012-10-03 MED ORDER — PROPOFOL 10 MG/ML IV EMUL
INTRAVENOUS | Status: DC | PRN
  Administered 2012-10-03: 200 mg via INTRAVENOUS

## 2012-10-03 MED ORDER — VASOPRESSIN 20 UNIT/ML IJ SOLN
INTRAMUSCULAR | Status: AC
Start: 2012-10-03 — End: 2012-10-03
  Filled 2012-10-03: qty 1

## 2012-10-03 MED ORDER — FENTANYL CITRATE 0.05 MG/ML IJ SOLN
25.0000 ug | INTRAMUSCULAR | Status: DC | PRN
  Administered 2012-10-03: 50 ug via INTRAVENOUS

## 2012-10-03 MED ORDER — SCOPOLAMINE 1 MG/3DAYS TD PT72: 1.0000 | MEDICATED_PATCH | TRANSDERMAL | Status: DC

## 2012-10-03 MED ORDER — ESTRADIOL 0.1 MG/GM VA CREA
TOPICAL_CREAM | VAGINAL | Status: AC
  Filled 2012-10-03: qty 42.5

## 2012-10-03 MED ORDER — ACETAMINOPHEN 10 MG/ML IV SOLN
INTRAVENOUS | Status: AC
  Administered 2012-10-03: 1000 mg via INTRAVENOUS
  Filled 2012-10-03: qty 100

## 2012-10-03 MED ORDER — FENTANYL CITRATE 0.05 MG/ML IJ SOLN
INTRAMUSCULAR | Status: AC
  Administered 2012-10-03: 50 ug via INTRAVENOUS
  Filled 2012-10-03: qty 2

## 2012-10-03 MED ORDER — FENTANYL CITRATE 0.05 MG/ML IJ SOLN
INTRAMUSCULAR | Status: AC
  Filled 2012-10-03: qty 2

## 2012-10-03 MED ORDER — ROCURONIUM BROMIDE 100 MG/10ML IV SOLN
INTRAVENOUS | Status: DC | PRN
  Administered 2012-10-03: 50 mg via INTRAVENOUS

## 2012-10-03 MED ORDER — SERTRALINE HCL 100 MG PO TABS
100.0000 mg | ORAL_TABLET | Freq: Every day | ORAL | Status: DC
  Administered 2012-10-04: 100 mg via ORAL
  Filled 2012-10-03: qty 1

## 2012-10-03 MED ORDER — ALBUTEROL SULFATE HFA 108 (90 BASE) MCG/ACT IN AERS
2.0000 | INHALATION_SPRAY | Freq: Four times a day (QID) | RESPIRATORY_TRACT | Status: DC | PRN
  Administered 2012-10-04: 2 via RESPIRATORY_TRACT
  Filled 2012-10-03: qty 6.7

## 2012-10-03 MED ORDER — MEPERIDINE HCL 25 MG/ML IJ SOLN
INTRAMUSCULAR | Status: AC
  Administered 2012-10-03: 12.5 mg via INTRAVENOUS
  Filled 2012-10-03: qty 1

## 2012-10-03 MED ORDER — FENTANYL CITRATE 0.05 MG/ML IJ SOLN
INTRAMUSCULAR | Status: DC | PRN
  Administered 2012-10-03 (×2): 50 ug via INTRAVENOUS
  Administered 2012-10-03: 100 ug via INTRAVENOUS
  Administered 2012-10-03: 50 ug via INTRAVENOUS
  Administered 2012-10-03: 100 ug via INTRAVENOUS

## 2012-10-03 MED ORDER — LACTATED RINGERS IV SOLN: INTRAVENOUS | Status: DC

## 2012-10-03 MED ORDER — KETOROLAC TROMETHAMINE 30 MG/ML IJ SOLN
INTRAMUSCULAR | Status: AC
  Administered 2012-10-03: 30 mg via INTRAVENOUS
  Filled 2012-10-03: qty 1

## 2012-10-03 MED ORDER — NEOSTIGMINE METHYLSULFATE 1 MG/ML IJ SOLN
INTRAMUSCULAR | Status: DC | PRN
  Administered 2012-10-03: 2 mg via INTRAVENOUS

## 2012-10-03 MED ORDER — ALPRAZOLAM 0.5 MG PO TABS
1.0000 mg | ORAL_TABLET | Freq: Two times a day (BID) | ORAL | Status: DC | PRN
  Administered 2012-10-03: 1 mg via ORAL
  Filled 2012-10-03 (×2): qty 1

## 2012-10-03 MED ORDER — NEOSTIGMINE METHYLSULFATE 1 MG/ML IJ SOLN
INTRAMUSCULAR | Status: AC
  Filled 2012-10-03: qty 1

## 2012-10-03 MED ORDER — METOCLOPRAMIDE HCL 5 MG/ML IJ SOLN: 10.0000 mg | Freq: Once | INTRAMUSCULAR | Status: DC | PRN

## 2012-10-03 MED ORDER — ROCURONIUM BROMIDE 50 MG/5ML IV SOLN
INTRAVENOUS | Status: AC
Start: 2012-10-03 — End: 2012-10-03
  Filled 2012-10-03: qty 1

## 2012-10-03 MED ORDER — LIDOCAINE HCL (CARDIAC) 20 MG/ML IV SOLN
INTRAVENOUS | Status: AC
  Filled 2012-10-03: qty 5

## 2012-10-03 MED ORDER — LIDOCAINE HCL (CARDIAC) 20 MG/ML IV SOLN
INTRAVENOUS | Status: DC | PRN
  Administered 2012-10-03: 50 mg via INTRAVENOUS

## 2012-10-03 MED ORDER — MIDAZOLAM HCL 5 MG/5ML IJ SOLN
INTRAMUSCULAR | Status: DC | PRN
  Administered 2012-10-03: 2 mg via INTRAVENOUS

## 2012-10-03 MED ORDER — 0.9 % SODIUM CHLORIDE (POUR BTL) OPTIME
TOPICAL | Status: DC | PRN
  Administered 2012-10-03: 1000 mL

## 2012-10-03 MED ORDER — DOCUSATE SODIUM 100 MG PO CAPS
100.0000 mg | ORAL_CAPSULE | Freq: Two times a day (BID) | ORAL | Status: DC
  Administered 2012-10-03 – 2012-10-04 (×2): 100 mg via ORAL
  Filled 2012-10-03 (×2): qty 1

## 2012-10-03 MED ORDER — ONDANSETRON HCL 4 MG/2ML IJ SOLN: 4.0000 mg | Freq: Four times a day (QID) | INTRAMUSCULAR | Status: DC | PRN

## 2012-10-03 MED ORDER — LACTATED RINGERS IV SOLN
INTRAVENOUS | Status: DC
  Administered 2012-10-03: 125 mL/h via INTRAVENOUS
  Administered 2012-10-03: 08:00:00 via INTRAVENOUS

## 2012-10-03 MED ORDER — VASOPRESSIN 20 UNIT/ML IJ SOLN
INTRAVENOUS | Status: DC | PRN
  Administered 2012-10-03: 08:00:00 via INTRAMUSCULAR

## 2012-10-03 MED ORDER — PHENYLEPHRINE HCL 10 MG/ML IJ SOLN
INTRAMUSCULAR | Status: DC | PRN
  Administered 2012-10-03 (×5): .04 mg via INTRAVENOUS
  Administered 2012-10-03: .08 mg via INTRAVENOUS

## 2012-10-03 MED ORDER — DEXAMETHASONE SODIUM PHOSPHATE 10 MG/ML IJ SOLN
INTRAMUSCULAR | Status: AC
  Filled 2012-10-03: qty 1

## 2012-10-03 MED ORDER — SIMETHICONE 80 MG PO CHEW: 80.0000 mg | CHEWABLE_TABLET | Freq: Four times a day (QID) | ORAL | Status: DC | PRN

## 2012-10-03 MED ORDER — ACETAMINOPHEN 10 MG/ML IV SOLN
1000.0000 mg | Freq: Once | INTRAVENOUS | Status: DC
  Filled 2012-10-03: qty 100

## 2012-10-03 MED ORDER — OXYCODONE-ACETAMINOPHEN 5-325 MG PO TABS
1.0000 | ORAL_TABLET | ORAL | Status: DC | PRN
  Administered 2012-10-03 (×2): 2 via ORAL
  Administered 2012-10-03: 1 via ORAL
  Filled 2012-10-03: qty 2
  Filled 2012-10-03: qty 1
  Filled 2012-10-03: qty 2

## 2012-10-03 MED ORDER — PROPOFOL 10 MG/ML IV EMUL
INTRAVENOUS | Status: AC
  Filled 2012-10-03: qty 20

## 2012-10-03 MED ORDER — KETOROLAC TROMETHAMINE 30 MG/ML IJ SOLN: 30.0000 mg | Freq: Once | INTRAMUSCULAR | Status: AC

## 2012-10-03 MED ORDER — ONDANSETRON HCL 4 MG/2ML IJ SOLN
INTRAMUSCULAR | Status: AC
  Filled 2012-10-03: qty 2

## 2012-10-03 MED ORDER — ALBUTEROL 90 MCG/ACT IN AERS: 2.0000 | INHALATION_SPRAY | Freq: Four times a day (QID) | RESPIRATORY_TRACT | Status: DC | PRN

## 2012-10-03 MED ORDER — MORPHINE SULFATE 4 MG/ML IJ SOLN
1.0000 mg | INTRAMUSCULAR | Status: DC | PRN
  Administered 2012-10-03: 1 mg via INTRAVENOUS
  Filled 2012-10-03: qty 1

## 2012-10-03 MED ORDER — GLYCOPYRROLATE 0.2 MG/ML IJ SOLN
INTRAMUSCULAR | Status: DC | PRN
  Administered 2012-10-03: 0.2 mg via INTRAVENOUS
  Administered 2012-10-03: 0.4 mg via INTRAVENOUS

## 2012-10-03 MED ORDER — FENTANYL CITRATE 0.05 MG/ML IJ SOLN
INTRAMUSCULAR | Status: AC
  Filled 2012-10-03: qty 5

## 2012-10-03 MED ORDER — SCOPOLAMINE 1 MG/3DAYS TD PT72
MEDICATED_PATCH | TRANSDERMAL | Status: AC
  Administered 2012-10-03: 1.5 mg via TRANSDERMAL
  Filled 2012-10-03: qty 1

## 2012-10-03 MED ORDER — DEXAMETHASONE SODIUM PHOSPHATE 4 MG/ML IJ SOLN
INTRAMUSCULAR | Status: DC | PRN
  Administered 2012-10-03: 8 mg via INTRAVENOUS

## 2012-10-03 SURGICAL SUPPLY — 36 items
ADH SKN CLS APL DERMABOND .7 (GAUZE/BANDAGES/DRESSINGS) ×2
CABLE HIGH FREQUENCY MONO STRZ (ELECTRODE) IMPLANT
CATH ROBINSON RED A/P 16FR (CATHETERS) ×1 IMPLANT
CHLORAPREP W/TINT 26ML (MISCELLANEOUS) ×2 IMPLANT
CLOTH BEACON ORANGE TIMEOUT ST (SAFETY) ×2 IMPLANT
CONT PATH 16OZ SNAP LID 3702 (MISCELLANEOUS) ×2 IMPLANT
COVER TABLE BACK 60X90 (DRAPES) ×2 IMPLANT
DECANTER SPIKE VIAL GLASS SM (MISCELLANEOUS) ×3 IMPLANT
DERMABOND ADVANCED (GAUZE/BANDAGES/DRESSINGS) ×2
DERMABOND ADVANCED .7 DNX12 (GAUZE/BANDAGES/DRESSINGS) IMPLANT
ELECT REM PT RETURN 9FT ADLT (ELECTROSURGICAL) ×2
ELECTRODE REM PT RTRN 9FT ADLT (ELECTROSURGICAL) IMPLANT
GLOVE BIO SURGEON STRL SZ 6.5 (GLOVE) ×4 IMPLANT
GLOVE BIOGEL PI IND STRL 6.5 (GLOVE) ×1 IMPLANT
GLOVE BIOGEL PI INDICATOR 6.5 (GLOVE) ×2
GOWN STRL REIN XL XLG (GOWN DISPOSABLE) ×8 IMPLANT
NEEDLE INSUFFLATION 120MM (ENDOMECHANICALS) ×2 IMPLANT
NS IRRIG 1000ML POUR BTL (IV SOLUTION) ×2 IMPLANT
PACK LAVH (CUSTOM PROCEDURE TRAY) ×2 IMPLANT
PROTECTOR NERVE ULNAR (MISCELLANEOUS) ×3 IMPLANT
SCALPEL HARMONIC ACE (MISCELLANEOUS) ×2 IMPLANT
SET IRRIG TUBING LAPAROSCOPIC (IRRIGATION / IRRIGATOR) ×2 IMPLANT
STRIP CLOSURE SKIN 1/4X3 (GAUZE/BANDAGES/DRESSINGS) IMPLANT
SUT SILK 0 FSL (SUTURE) ×2 IMPLANT
SUT VIC AB 0 CT1 18XCR BRD8 (SUTURE) ×3 IMPLANT
SUT VIC AB 0 CT1 8-18 (SUTURE) ×6
SUT VIC AB 2-0 CT1 (SUTURE) ×3 IMPLANT
SUT VICRYL 0 TIES 12 18 (SUTURE) IMPLANT
SUT VICRYL 0 UR6 27IN ABS (SUTURE) IMPLANT
SUT VICRYL 4-0 PS2 18IN ABS (SUTURE) ×2 IMPLANT
TOWEL OR 17X24 6PK STRL BLUE (TOWEL DISPOSABLE) ×5 IMPLANT
TRAY FOLEY CATH 14FR (SET/KITS/TRAYS/PACK) ×2 IMPLANT
TROCAR XCEL NON-BLD 5MMX100MML (ENDOMECHANICALS) ×2 IMPLANT
TROCAR XCEL OPT SLVE 5M 100M (ENDOMECHANICALS) ×4 IMPLANT
WARMER LAPAROSCOPE (MISCELLANEOUS) ×2 IMPLANT
WATER STERILE IRR 1000ML POUR (IV SOLUTION) ×2 IMPLANT

## 2012-10-03 NOTE — Transfer of Care (Signed)
Immediate Anesthesia Transfer of Care Note  Patient: [name] [name]  Procedure(s) Performed: Procedure(s): LAPAROSCOPIC ASSISTED VAGINAL HYSTERECTOMY (Bilateral) BILATERAL SALPINGECTOMY (Bilateral)  Patient Location: PACU  Anesthesia Type:General  Level of Consciousness: awake, alert  and oriented  Airway & Oxygen Therapy: Patient Spontanous Breathing and Patient connected to nasal cannula oxygen  Post-op Assessment: Report given to PACU RN and Post -op Vital signs reviewed and stable  Post vital signs: Reviewed and stable  Complications: No apparent anesthesia complications

## 2012-10-03 NOTE — Progress Notes (Signed)
Patient ID: [name] [name], female   DOB: May 22, 1976, 37 y.o.   MRN: [id number] Per pt no changes in dictated H&P.  Brief exam WNL.

## 2012-10-03 NOTE — Anesthesia Postprocedure Evaluation (Signed)
  Anesthesia Post-op Note  Patient: [name] [name]  Procedure(s) Performed: Procedure(s): LAPAROSCOPIC ASSISTED VAGINAL HYSTERECTOMY (Bilateral) BILATERAL SALPINGECTOMY (Bilateral)  Patient Location: [facility]  Anesthesia Type:General  Level of Consciousness: awake, alert  and oriented  Airway and Oxygen Therapy: Patient Spontanous Breathing and Patient connected to nasal cannula oxygen  Post-op Pain: mild  Post-op Assessment: Post-op Vital signs reviewed and Patient's Cardiovascular Status Stable  Post-op Vital Signs: Reviewed and stable  Complications: No apparent anesthesia complications

## 2012-10-03 NOTE — Op Note (Signed)
Operative report  Preoperative diagnosis Menorrhagia Dysmenorrhea  Postoperative diagnosis Same  Procedure Laparoscopic-assisted vaginal hysterectomy and bilateral salpingectomy  Surgeon Dr. Huel Cote  Assistant Dr. Sherron Monday  Anesthesia Gen.  Findings The ovaries appeared normal bilaterally the left ovary had a corpus luteum cyst. The fallopian tubes had a prior tubal ligation but appeared normal otherwise.  The uterus was normal size and had a slight indentation on the right but no obvious fibroids. There was no endometriosis noted.  Fluids Estimated blood loss 150 cc Urine output 100 cc clear IV fluids 2200 cc LR  Specimen Uterus cervix and bilateral fallopian tubes sent to pathology  Procedure note After informed consent was obtained from the patient she was taken to the operating room where general anesthesia was obtained without difficulty. She was prepped and draped in the normal sterile fashion in the dorsal lithotomy position. An appropriate time out was performed. Attention was then turned to the abdomen where the infraumbilical area was injected with quarter percent Marcaine and a small incision made with the scalpel. The varies needle was then easily introduced into the abdomen and pneumoperitoneum obtained with approximately 3 L of CO2 gas. The 5 mm Optiview trocar was then placed under direct visualization in the umbilical site. The abdomen and pelvis were inspected with findings as previously stated. The other 25 mm ports were then placed in the upper lateral quadrants under direct visualization after injection with quarter percent Marcaine. With the trochars in place the right fallopian tube was elevated and the distal fallopian tube was removed with the harmonic scalpel due to to the previous tubal ligation in the tubing interrupted. This portion was placed in the posterior cul-de-sac. The remainder of the broad ligament and round ligament were then taken  down with harmonic scalpel and the bladder flap created to the midline of the uterus. In a similar fashion the left fallopian tube was elevated and the fimbriated distal end was removed with the harmonic scalpel and placed in the cul-de-sac the remaining broad ligament and round ligament were likewise taken down with the harmonic scalpel and the bladder flap developed to the midline and pushed away from the lower uterine segment and cervix. And all pedicles appeared hemostatic therefore the instruments were removed from the abdomen and the trochars left in place. The abdomen was sterilely draped and attention turned vaginally. The weighted speculum was placed within the vagina cervix identified and grasped with [place] tenaculums x2. The cervix was injected circumferentially with a dilute solution of Pitressin. The Bovie cautery was then utilized to make a circumferential incision around the cervix and this was further developed with the Mayo scissors to dissect the vaginal mucosa off the underlying cervix. The posterior cul-de-sac was entered sharply with Mayo scissors and a banana speculum placed within this incision. The anterior cul-de-sac was then entered sharply as well and the Deaver retractor placed within it. Thus with the rectum and bladder isolated from the uterus the Zeppelin clamps were utilized to clamp the uterosacral ligaments bilaterally. Each side was transected and suture ligated with 0 Vicryl which was held in the hemostat. The remainder of the paracervical tissue broad ligament were taken down sequentially with Zeppelin clamps each step transected and suture ligated with 0 Vicryl. When the uterus could be easily flipped it was and the remaining pedicle was taken down with Zeppelin clamps. The specimen was then handed off to pathology.  The last pedicles were secured with free tie and suture ligature of 0 Vicryl.  Good hemostasis was noted. At this point the banana speculum was removed from the  vagina and the shorter weighted speculum placed within the uterosacral ligaments were reapproximated with a 0 Vicryl suture and the vaginal cuff included with this. Before this was tied down the posterior cuff was run in a running locked suture of 2-0 Vicryl for hemostasis. Once this was completed the uterosacral ligaments were tied together and good support noted. The remaining sutures were trimmed and the vaginal cuff was then closed with 2-0 Vicryl in a running locked fashion. There is no active bleeding noted and all instruments and sponges were removed from the vagina. Gloves were changed and attention was returned to the abdomen were once again pneumoperitoneum was obtained. The pelvis and pedicles were year gaited copiously and no significant active bleeding noted the left pedicle near the ovary was reinforced with harmonic scalpel due to some possible oozing. The ureters were visualized bilaterally and appeared to be normal caliber. The pressure was taken down and no active bleeding noted. Therefore the 5 mm trochars were removed under direct visualization and the pneumoperitoneum evacuated through the umbilical port. Finally the umbilical trocar was removed. All trocar sites were closed with one subcuticular stitch of 4-0 Vicryl and Dermabond. All instrument counts were again correct and the patient was taken to the recovery room in good condition.

## 2012-10-03 NOTE — Brief Op Note (Signed)
10/03/2012  9:11 AM  PATIENT:  [name] [name]  37 y.o. female  PRE-OPERATIVE DIAGNOSIS:  menorrhagia 69629, dysmenorrhea  POST-OPERATIVE DIAGNOSIS:  menorrhagia  PROCEDURE:  Procedure(s): LAPAROSCOPIC ASSISTED VAGINAL HYSTERECTOMY (Bilateral) BILATERAL SALPINGECTOMY (Bilateral)  SURGEON:  Surgeon(s) and Role:    * [name], MD - Primary    * Sherron Monday, MD - Assisting   ANESTHESIA:   general  EBL:  Total I/O In: 1000 [I.V.:1000] Out: 250 [Urine:100; Blood:150]  BLOOD ADMINISTERED:none  DRAINS: Urinary Catheter (Foley)   LOCAL MEDICATIONS USED:  MARCAINE     SPECIMEN:  Uterus, cervix and tubes DISPOSITION OF SPECIMEN:  PATHOLOGY  COUNTS:  YES  TOURNIQUET:  * No tourniquets in log *  DICTATION: .Dragon Dictation  PLAN OF CARE: Admit for overnight observation  PATIENT DISPOSITION:  PACU - hemodynamically stable.

## 2012-10-03 NOTE — Anesthesia Postprocedure Evaluation (Signed)
  Anesthesia Post Note  Patient: [name] [name]  Procedure(s) Performed: Procedure(s) (LRB): LAPAROSCOPIC ASSISTED VAGINAL HYSTERECTOMY (Bilateral) BILATERAL SALPINGECTOMY (Bilateral)  Anesthesia type: GA  Patient location: PACU  Post pain: Pain level controlled  Post assessment: Post-op Vital signs reviewed  Last Vitals:  Filed Vitals:   10/03/12 0945  BP:   Pulse: 83  Temp:   Resp: 18    Post vital signs: Reviewed  Level of consciousness: sedated  Complications: No apparent anesthesia complications

## 2012-10-04 ENCOUNTER — Encounter (HOSPITAL_COMMUNITY): Payer: Self-pay | Admitting: Obstetrics and Gynecology

## 2012-10-04 LAB — CBC
MCH: 31.7 pg (ref 26.0–34.0)
MCHC: 34 g/dL (ref 30.0–36.0)
MCV: 93.3 fL (ref 78.0–100.0)
Platelets: 200 10*3/uL (ref 150–400)
RDW: 12 % (ref 11.5–15.5)

## 2012-10-04 LAB — BASIC METABOLIC PANEL
Calcium: 8.7 mg/dL (ref 8.4–10.5)
Creatinine, Ser: 0.86 mg/dL (ref 0.50–1.10)
GFR calc Af Amer: >90 mL/min (ref >90)
GFR calc non Af Amer: 87 mL/min — ABNORMAL LOW (ref >90)

## 2012-10-04 MED ORDER — IBUPROFEN 600 MG PO TABS: 600.0000 mg | ORAL_TABLET | Freq: Four times a day (QID) | ORAL | Status: DC | PRN

## 2012-10-04 MED ORDER — OXYCODONE-ACETAMINOPHEN 5-325 MG PO TABS: 1.0000 | ORAL_TABLET | ORAL | Status: DC | PRN

## 2012-10-04 NOTE — Discharge Summary (Signed)
Physician Discharge Summary  Patient ID: [name] [name] MRN: [id number] DOB/AGE: 1975/08/08 37 y.o.  Admit date: 10/03/2012 Discharge date: 10/04/2012  Admission Diagnoses:  Menorrhagia                                          Dysmenorrhea  Discharge Diagnoses: same                                         S/p LAVH/Bilateral salpingectomy  Active Problems:   Menorrhagia   Dysmenorrhea   S/P laparoscopic assisted vaginal hysterectomy (LAVH)   Discharged Condition: good  Hospital Course: Pt admitted to observation after LAVH and did well.  Post-op course uneventful. Able to void, ambulate and tolerate po on discharge.   Significant Diagnostic Studies: labs: CBC  Treatments: surgery: LAVH/bilateral salpingectomy  Discharge Exam: Blood pressure 90/49, pulse 62, temperature 98.3 F (36.8 C), temperature source Oral, resp. rate 18, height 5\' 8"  (1.727 m), weight 66.679 kg (147 lb), SpO2 100.00%. General appearance: alert and cooperative Resp: clear to auscultation bilaterally Cardio: regular rate and rhythm GI: soft NT, Incisions well-approximated  Disposition:home     Discharge Orders   Future Orders Complete By Expires     Diet - low sodium heart healthy  As directed     Discharge instructions  As directed     Comments:      Nothing in vagina for 6 weeks. No heavy lifting greater than 10 lbs. Avoid driving for at least 1-2 weeks or until off narcotic pain meds. Shower over incision and pat dry.    Increase activity slowly  As directed         Medication List    TAKE these medications       albuterol 90 MCG/ACT inhaler  Commonly known as:  PROVENTIL,VENTOLIN  Inhale 2 puffs into the lungs every 6 (six) hours as needed.     ALPRAZolam 0.5 MG tablet  Commonly known as:  XANAX  Take 1 mg by mouth 2 (two) times daily as needed for anxiety.     ibuprofen 600 MG tablet  Commonly known as:  ADVIL,MOTRIN  Take 1 tablet (600 mg total) by mouth every 6 (six) hours as needed  (mild pain).     multivitamin tablet  Take 1 tablet by mouth daily.     oxyCODONE-acetaminophen 5-325 MG per tablet  Commonly known as:  PERCOCET/ROXICET  Take 1-2 tablets by mouth every 4 (four) hours as needed.     sertraline 100 MG tablet  Commonly known as:  ZOLOFT  Take 100 mg by mouth daily.       Follow-up Information   Follow up with [name], MD. Schedule an appointment as soon as possible for a visit in 2 weeks. (incision check)    Contact information:   [address], [address] [place] [address] [phone]       Signed: [name] 10/04/2012, 8:13 AM

## 2012-10-04 NOTE — Progress Notes (Signed)
Patient ID: [name] [name], female   DOB: 06-Oct-1975, 37 y.o.   MRN: [id number] Pt doing well.  No c/o Ambulating and tolerating po well. Pain controlled D/c home

## 2012-10-04 NOTE — Progress Notes (Signed)
Pt discharged to home with significant other.  Condition stable.  Pt ambulated to car with Stevphen Meuse, NT.  No equipment for home ordered at discharge.

## 2013-06-05 ENCOUNTER — Ambulatory Visit: Payer: Managed Care, Other (non HMO) | Admitting: Internal Medicine

## 2013-06-05 ENCOUNTER — Telehealth: Payer: Self-pay | Admitting: Family Medicine

## 2013-06-05 NOTE — Telephone Encounter (Signed)
Error

## 2015-07-16 DIAGNOSIS — G4700 Insomnia, unspecified: Secondary | ICD-10-CM | POA: Diagnosis not present

## 2015-07-16 DIAGNOSIS — F419 Anxiety disorder, unspecified: Secondary | ICD-10-CM | POA: Diagnosis not present

## 2015-07-16 DIAGNOSIS — Z13220 Encounter for screening for lipoid disorders: Secondary | ICD-10-CM | POA: Diagnosis not present

## 2015-07-16 DIAGNOSIS — Z131 Encounter for screening for diabetes mellitus: Secondary | ICD-10-CM | POA: Diagnosis not present

## 2015-07-16 DIAGNOSIS — Z0000 Encounter for general adult medical examination without abnormal findings: Secondary | ICD-10-CM | POA: Diagnosis not present

## 2015-07-16 MED FILL — ARIPiprazole 5 MG TABS: 5 | 30 days supply | Qty: 30 | Fill #0

## 2015-07-26 MED FILL — ZOLPIDEM TARTRATE 5 MG TAB: 5 | 15 days supply | Qty: 15 | Fill #0

## 2015-08-07 DIAGNOSIS — G479 Sleep disorder, unspecified: Secondary | ICD-10-CM | POA: Diagnosis not present

## 2015-08-07 DIAGNOSIS — F411 Generalized anxiety disorder: Secondary | ICD-10-CM | POA: Diagnosis not present

## 2015-08-07 MED FILL — ALPRAZolam 1 MG TABS: 1 | 90 days supply | Qty: 180 | Fill #0

## 2015-08-07 MED FILL — ARIPiprazole 5 MG TABS: 5 | 90 days supply | Qty: 90 | Fill #0

## 2015-09-17 ENCOUNTER — Emergency Department (HOSPITAL_COMMUNITY)
Admission: EM | Admit: 2015-09-17 | Discharge: 2015-09-17 | Disposition: A | Discharge status: Home / Self Care | Payer: 59 | Attending: Emergency Medicine | Admitting: Emergency Medicine

## 2015-09-17 ENCOUNTER — Emergency Department (HOSPITAL_COMMUNITY): Payer: 59

## 2015-09-17 ENCOUNTER — Encounter (HOSPITAL_COMMUNITY): Payer: Self-pay | Admitting: Emergency Medicine

## 2015-09-17 DIAGNOSIS — R0789 Other chest pain: Secondary | ICD-10-CM

## 2015-09-17 DIAGNOSIS — R079 Chest pain, unspecified: Secondary | ICD-10-CM | POA: Diagnosis not present

## 2015-09-17 DIAGNOSIS — Y9389 Activity, other specified: Secondary | ICD-10-CM | POA: Diagnosis not present

## 2015-09-17 DIAGNOSIS — Z79899 Other long term (current) drug therapy: Secondary | ICD-10-CM | POA: Diagnosis not present

## 2015-09-17 DIAGNOSIS — F419 Anxiety disorder, unspecified: Secondary | ICD-10-CM | POA: Diagnosis not present

## 2015-09-17 DIAGNOSIS — Y9289 Other specified places as the place of occurrence of the external cause: Secondary | ICD-10-CM | POA: Diagnosis not present

## 2015-09-17 DIAGNOSIS — F329 Major depressive disorder, single episode, unspecified: Secondary | ICD-10-CM | POA: Insufficient documentation

## 2015-09-17 DIAGNOSIS — J45909 Unspecified asthma, uncomplicated: Secondary | ICD-10-CM | POA: Insufficient documentation

## 2015-09-17 DIAGNOSIS — W228XXA Striking against or struck by other objects, initial encounter: Secondary | ICD-10-CM | POA: Insufficient documentation

## 2015-09-17 DIAGNOSIS — S29001A Unspecified injury of muscle and tendon of front wall of thorax, initial encounter: Secondary | ICD-10-CM | POA: Insufficient documentation

## 2015-09-17 DIAGNOSIS — Y998 Other external cause status: Secondary | ICD-10-CM | POA: Insufficient documentation

## 2015-09-17 MED ORDER — HYDROCODONE-ACETAMINOPHEN 5-325 MG PO TABS: 1.0000 | ORAL_TABLET | Freq: Four times a day (QID) | ORAL | Status: AC | PRN

## 2015-09-17 MED ORDER — CYCLOBENZAPRINE HCL 10 MG PO TABS: 10.0000 mg | ORAL_TABLET | Freq: Three times a day (TID) | ORAL | Status: AC | PRN

## 2015-09-17 MED ORDER — IBUPROFEN 600 MG PO TABS: 600.0000 mg | ORAL_TABLET | Freq: Three times a day (TID) | ORAL | Status: AC | PRN

## 2015-09-17 MED FILL — HYDROCODON-APAP 5-325: 5-325 | 3 days supply | Qty: 12 | Fill #0

## 2015-09-17 MED FILL — CYCLOBENZAPRINE 10 MG TAB: 10 | 4 days supply | Qty: 12 | Fill #0

## 2015-09-17 NOTE — ED Provider Notes (Signed)
CSN: [id number]     Arrival date & time 09/17/15  0737 History   First MD Initiated Contact with Patient 09/17/15 0745     Chief Complaint  Patient presents with  . rib cage pain       HPI Patient presents to the emergency department with complaints of left anterior chest and rib pain worse over the past several days.  Pain is moderate to severe in severity and worse with movement and lying back.  She states coughing and sneezing tends to hurt her left anterior chest.  She holds just under her left breast.  She reports that [place] [employer] [place] this [place] and was going over an obstacle and continuously hit her left anterior chest on the top of the wall that she was trying to get over.  She reports initially she didn't have pain but the pain is been persistent since then and seems to be worsening.  No shortness of breath.  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  No other complaints    Past Medical History  Diagnosis Date  . Papanicolaou smear of cervix with atypical squamous cells of undetermined significance (ASC-US)   . Persistent disorder of initiating or maintaining sleep   . Depressive disorder, not elsewhere classified   . Unspecified asthma(493.90)   . Anxiety state, unspecified   . Allergic rhinitis, cause unspecified   . Migraine    Past Surgical History  Procedure Laterality Date  . Tubal ligation    . Endometrial ablation    . Laparoscopic assisted vaginal hysterectomy Bilateral 10/03/2012    Procedure: LAPAROSCOPIC ASSISTED VAGINAL HYSTERECTOMY;  Surgeon: Oliver PilaKathy W Richardson, MD;  Location: [facility];  Service: Gynecology;  Laterality: Bilateral;  . Bilateral salpingectomy Bilateral 10/03/2012    Procedure: BILATERAL SALPINGECTOMY;  Surgeon: Oliver PilaKathy W Richardson, MD;  Location: [facility];  Service: Gynecology;  Laterality: Bilateral;    Family History  Problem Relation Age of Onset  . Hypertension Mother   . Hyperlipidemia Mother   . Crohn's disease Sister   . Heart attack Maternal Grandfather 40  . Cancer Paternal Grandmother     ovarian  . Cancer Maternal Aunt 50    breast  . Cancer Paternal Grandfather     lung   Social History  Substance Use Topics  . Smoking status: Never Smoker   . Smokeless tobacco: None  . Alcohol Use: Yes     Comment: socially   OB History    No data available     Review of Systems  All other systems reviewed and are negative.     Allergies  Dilaudid  Home Medications   Prior to Admission medications   Medication Sig Start Date End Date Taking? Authorizing Provider  albuterol (PROVENTIL,VENTOLIN) 90 MCG/ACT inhaler Inhale 2 puffs into the lungs every 6 (six) hours as needed.      Historical Provider, MD  ALPRAZolam Prudy Feeler(XANAX) 0.5 MG tablet Take 1 mg by mouth 2 (two) times daily as needed for anxiety.     Historical Provider, MD  ibuprofen (ADVIL,MOTRIN) 600 MG tablet Take 1 tablet (600 mg total) by mouth every 6 (six) hours as needed (mild pain). 10/04/12   Huel CoteKathy Richardson, MD  Multiple Vitamin (MULTIVITAMIN) tablet Take 1 tablet by mouth daily.      Historical Provider, MD  oxyCODONE-acetaminophen (  PERCOCET/ROXICET) 5-325 MG per tablet Take 1-2 tablets by mouth every 4 (four) hours as needed. 10/04/12   Huel Cote, MD  sertraline (ZOLOFT) 100 MG tablet Take 100 mg by mouth daily.      Historical Provider, MD   BP 118/84 mmHg  Pulse 78  Temp(Src) 97.9 F (36.6 C) (Oral)  Resp 15  SpO2 99%  LMP 09/24/2012 Physical Exam  Constitutional: She is oriented to person, place, and time. She appears well-developed and well-nourished.  HENT:  Head: Normocephalic.  Eyes: EOM are normal.  Neck: Normal range of motion.  Pulmonary/Chest: Effort normal.  Left anterior chest wall tenderness without bruising or ecchymosis.  Small amount of swelling noted.  Abdominal: She  exhibits no distension. There is no tenderness.  Musculoskeletal: Normal range of motion.  Neurological: She is alert and oriented to person, place, and time.  Psychiatric: She has a normal mood and affect.  Nursing note and vitals reviewed.   ED Course  Procedures (including critical care time) Labs Review Labs Reviewed - No data to display  Imaging Review Dg Chest 2 View  09/17/2015  CLINICAL DATA:  Hit left side ribs climbing over walls 09/14/2015, left side chest pain EXAM: CHEST  2 VIEW COMPARISON:  None. FINDINGS: Cardiomediastinal silhouette is unremarkable. No acute infiltrate or pulmonary edema. No gross fractures are identified. There is no pneumothorax. IMPRESSION: No active cardiopulmonary disease. Electronically Signed   By: Natasha Mead M.D.   On: 09/17/2015 07:58   I have personally reviewed and evaluated these images and lab results as part of my medical decision-making.   EKG Interpretation None      MDM   Final diagnoses:  None    Likely anterior rib contusion.  Doubt ACS.  Doubt PE.  Chest x-ray without abnormality.  Patient be treated with also relaxants, anti-inflammatories, short course of hydrocodone.  Overall well-appearing.    Azalia Bilis, MD 09/17/15 [phone]

## 2015-09-17 NOTE — ED Notes (Signed)
Patient transported to X-ray 

## 2015-09-17 NOTE — ED Notes (Signed)
Discharge instructions, follow up care, and rx x3 reviewed with patient. Patient verbalized understanding. 

## 2015-09-17 NOTE — ED Notes (Signed)
MD at bedside. 

## 2015-09-17 NOTE — Discharge Instructions (Signed)
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
## 2015-10-14 MED FILL — SERTRALINE HCL 100 MG TAB: 100 | 90 days supply | Qty: 90 | Fill #0

## 2015-10-15 ENCOUNTER — Encounter (HOSPITAL_COMMUNITY): Payer: Self-pay | Admitting: *Deleted

## 2015-10-15 ENCOUNTER — Ambulatory Visit (HOSPITAL_COMMUNITY)
Admission: EM | Admit: 2015-10-15 | Discharge: 2015-10-15 | Disposition: A | Discharge status: Home / Self Care | Payer: 59 | Attending: Family Medicine | Admitting: Family Medicine

## 2015-10-15 DIAGNOSIS — J302 Other seasonal allergic rhinitis: Secondary | ICD-10-CM | POA: Diagnosis not present

## 2015-10-15 MED ORDER — METHYLPREDNISOLONE ACETATE 40 MG/ML IJ SUSP
80.0000 mg | Freq: Once | INTRAMUSCULAR | Status: AC
  Administered 2015-10-15: 80 mg via INTRAMUSCULAR

## 2015-10-15 MED ORDER — METHYLPREDNISOLONE ACETATE 80 MG/ML IJ SUSP
INTRAMUSCULAR | Status: AC
  Filled 2015-10-15: qty 1

## 2015-10-15 MED ORDER — MONTELUKAST SODIUM 10 MG PO TABS: 10.0000 mg | ORAL_TABLET | Freq: Every day | ORAL | Status: AC

## 2015-10-15 MED ORDER — TRIAMCINOLONE ACETONIDE 40 MG/ML IJ SUSP
INTRAMUSCULAR | Status: AC
  Filled 2015-10-15: qty 1

## 2015-10-15 MED ORDER — FLUTICASONE PROPIONATE 50 MCG/ACT NA SUSP: 1.0000 | Freq: Two times a day (BID) | NASAL | Status: DC

## 2015-10-15 MED ORDER — TRIAMCINOLONE ACETONIDE 40 MG/ML IJ SUSP
40.0000 mg | Freq: Once | INTRAMUSCULAR | Status: AC
  Administered 2015-10-15: 40 mg via INTRAMUSCULAR

## 2015-10-15 NOTE — ED Provider Notes (Signed)
CSN: [id number]     Arrival date & time 10/15/15  1312 History   First MD Initiated Contact with Patient 10/15/15 1407     Chief Complaint  Patient presents with  . URI   (Consider location/radiation/quality/duration/timing/severity/associated sxs/prior Treatment) Patient is a 40 y.o. female presenting with URI. The history is provided by the patient.  URI Presenting symptoms: congestion, cough, ear pain, rhinorrhea and sore throat   Presenting symptoms: no fever   Severity:  Moderate Onset quality:  Gradual Duration:  1 month Progression:  Worsening Chronicity:  New Relieved by:  Nothing Ineffective treatments:  Nebulizer treatments Associated symptoms: sneezing   Associated symptoms: no myalgias, no sinus pain and no wheezing   Risk factors: no sick contacts     Past Medical History  Diagnosis Date  . Papanicolaou smear of cervix with atypical squamous cells of undetermined significance (ASC-US)   . Persistent disorder of initiating or maintaining sleep   . Depressive disorder, not elsewhere classified   . Unspecified asthma(493.90)   . Anxiety state, unspecified   . Allergic rhinitis, cause unspecified   . Migraine    Past Surgical History  Procedure Laterality Date  . Tubal ligation    . Endometrial ablation    . Laparoscopic assisted vaginal hysterectomy Bilateral 10/03/2012    Procedure: LAPAROSCOPIC ASSISTED VAGINAL HYSTERECTOMY;  Surgeon: Oliver PilaKathy W Richardson, MD;  Location: [facility];  Service: Gynecology;  Laterality: Bilateral;  . Bilateral salpingectomy Bilateral 10/03/2012    Procedure: BILATERAL SALPINGECTOMY;  Surgeon: Oliver PilaKathy W Richardson, MD;  Location: [facility];  Service: Gynecology;  Laterality: Bilateral;   Family History  Problem Relation Age of Onset  . Hypertension Mother   . Hyperlipidemia Mother   . Crohn's disease Sister   . Heart attack Maternal Grandfather 40  . Cancer Paternal Grandmother     ovarian  . Cancer Maternal Aunt 50    breast  . Cancer  Paternal Grandfather     lung   Social History  Substance Use Topics  . Smoking status: Never Smoker   . Smokeless tobacco: None  . Alcohol Use: Yes     Comment: socially   OB History    No data available     Review of Systems  Constitutional: Negative.  Negative for fever.  HENT: Positive for congestion, ear pain, rhinorrhea, sneezing and sore throat.   Respiratory: Positive for cough. Negative for shortness of breath and wheezing.   Musculoskeletal: Negative for myalgias.  All other systems reviewed and are negative.   Allergies  Dilaudid  Home Medications   Prior to Admission medications   Medication Sig Start Date End Date Taking? Authorizing Provider  albuterol (PROVENTIL,VENTOLIN) 90 MCG/ACT inhaler Inhale 2 puffs into the lungs every 6 (six) hours as needed.      Historical Provider, MD  ALPRAZolam Prudy Feeler(XANAX) 0.5 MG tablet Take 1 mg by mouth 2 (two) times daily as needed for anxiety.     Historical Provider, MD  cyclobenzaprine (FLEXERIL) 10 MG tablet Take 1 tablet (10 mg total) by mouth 3 (three) times daily as needed for muscle spasms. 09/17/15   Azalia BilisKevin Campos, MD  fluticasone [facility](FLONASE) 50 MCG/ACT nasal spray Place 1 spray into both nostrils 2 (two) times daily. 10/15/15   Linna HoffJames D Kindl, MD  HYDROcodone-acetaminophen (NORCO/VICODIN) 5-325 MG tablet Take 1 tablet by mouth every 6 (six) hours as needed for moderate pain. 09/17/15   Azalia BilisKevin Campos, MD  ibuprofen (ADVIL,MOTRIN) 600 MG tablet Take 1 tablet (600 mg total) by  mouth every 8 (eight) hours as needed. 09/17/15   Azalia Bilis, MD  montelukast (SINGULAIR) 10 MG tablet Take 1 tablet (10 mg total) by mouth at bedtime. 10/15/15   Linna Hoff, MD  Multiple Vitamin (MULTIVITAMIN) tablet Take 1 tablet by mouth daily.      Historical Provider, MD  sertraline (ZOLOFT) 100 MG tablet Take 100 mg by mouth daily.      Historical Provider, MD   Meds Ordered and Administered this Visit   Medications  methylPREDNISolone acetate  (DEPO-MEDROL) injection 80 mg (80 mg Intramuscular Given 10/15/15 1427)  triamcinolone acetonide (KENALOG-40) injection 40 mg (40 mg Intramuscular Given 10/15/15 1427)    BP 122/74 mmHg  Pulse 72  Temp(Src) 98.6 F (37 C) (Oral)  SpO2 98%  LMP 09/24/2012 No data found.   Physical Exam  Constitutional: She is oriented to person, place, and time. She appears well-developed and well-nourished. No distress.  HENT:  Right Ear: External ear normal.  Left Ear: External ear normal.  Nose: Mucosal edema and rhinorrhea present.  Mouth/Throat: Oropharynx is clear and moist.  Neck: Normal range of motion. Neck supple.  Cardiovascular: Normal rate, regular rhythm, normal heart sounds and intact distal pulses.   Pulmonary/Chest: Effort normal and breath sounds normal.  Lymphadenopathy:    She has no cervical adenopathy.  Neurological: She is alert and oriented to person, place, and time.  Skin: Skin is warm and dry.  Nursing note and vitals reviewed.   ED Course  Procedures (including critical care time)  Labs Review Labs Reviewed - No data to display  Imaging Review No results found.   Visual Acuity Review  Right Eye Distance:   Left Eye Distance:   Bilateral Distance:    Right Eye Near:   Left Eye Near:    Bilateral Near:         MDM   1. Seasonal allergic reaction    Meds ordered this encounter  Medications  . methylPREDNISolone acetate (DEPO-MEDROL) injection 80 mg    Sig:   . triamcinolone acetonide (KENALOG-40) injection 40 mg    Sig:   . montelukast (SINGULAIR) 10 MG tablet    Sig: Take 1 tablet (10 mg total) by mouth at bedtime.    Dispense:  30 tablet    Refill:  1  . fluticasone (FLONASE) 50 MCG/ACT nasal spray    Sig: Place 1 spray into both nostrils 2 (two) times daily.    Dispense:  1 g    Refill:  2       Linna Hoff, MD 10/15/15 1428

## 2015-10-15 NOTE — ED Notes (Signed)
Pt  Reports   Symptoms  Of   Cough   Congested        sorethroat          With      r  Earache           The   Uri  Symptoms  Have been  Present  For  About  1  Month    And  Not getting better  And  The  Earache  Has  Been  Present  For  sev  Days     the  Pt     Pt  Reports  The  Symptoms  Have  Not  Been releived  By otc meds

## 2015-11-06 MED FILL — ALPRAZolam 1 MG TABS: 1 | 90 days supply | Qty: 180 | Fill #0

## 2016-01-20 MED FILL — SERTRALINE HCL 100 MG TAB: 100 | 90 days supply | Qty: 90 | Fill #1

## 2016-02-11 MED FILL — ALPRAZolam 1 MG TABS: 1 | 90 days supply | Qty: 180 | Fill #0

## 2016-03-02 DIAGNOSIS — F419 Anxiety disorder, unspecified: Secondary | ICD-10-CM | POA: Diagnosis not present

## 2016-03-02 DIAGNOSIS — G4700 Insomnia, unspecified: Secondary | ICD-10-CM | POA: Diagnosis not present

## 2016-03-02 MED FILL — lamoTRIgine 25 MG TABS: 25 | 30 days supply | Qty: 60 | Fill #0

## 2016-04-20 MED FILL — lamoTRIgine 25 MG TABS: 25 | 30 days supply | Qty: 60 | Fill #0

## 2016-05-11 MED FILL — ALPRAZolam 1 MG TABS: 1 | 90 days supply | Qty: 180 | Fill #0

## 2016-06-04 MED FILL — lamoTRIgine 25 MG TABS: 25 | 37 days supply | Qty: 60 | Fill #0

## 2016-06-04 MED FILL — SERTRALINE HCL 100 MG TAB: 100 | 90 days supply | Qty: 90 | Fill #0

## 2016-06-06 DIAGNOSIS — J0300 Acute streptococcal tonsillitis, unspecified: Secondary | ICD-10-CM | POA: Diagnosis not present

## 2016-06-06 DIAGNOSIS — J029 Acute pharyngitis, unspecified: Secondary | ICD-10-CM | POA: Diagnosis not present

## 2016-08-29 IMAGING — CR DG CHEST 2V
2 series · 2 of 2 positions shown · non-contrast
Comparison: None.

CLINICAL DATA: Hit left side ribs climbing over walls 09/14/2015,
left side chest pain

EXAM:
CHEST  2 VIEW

[w chest pa]
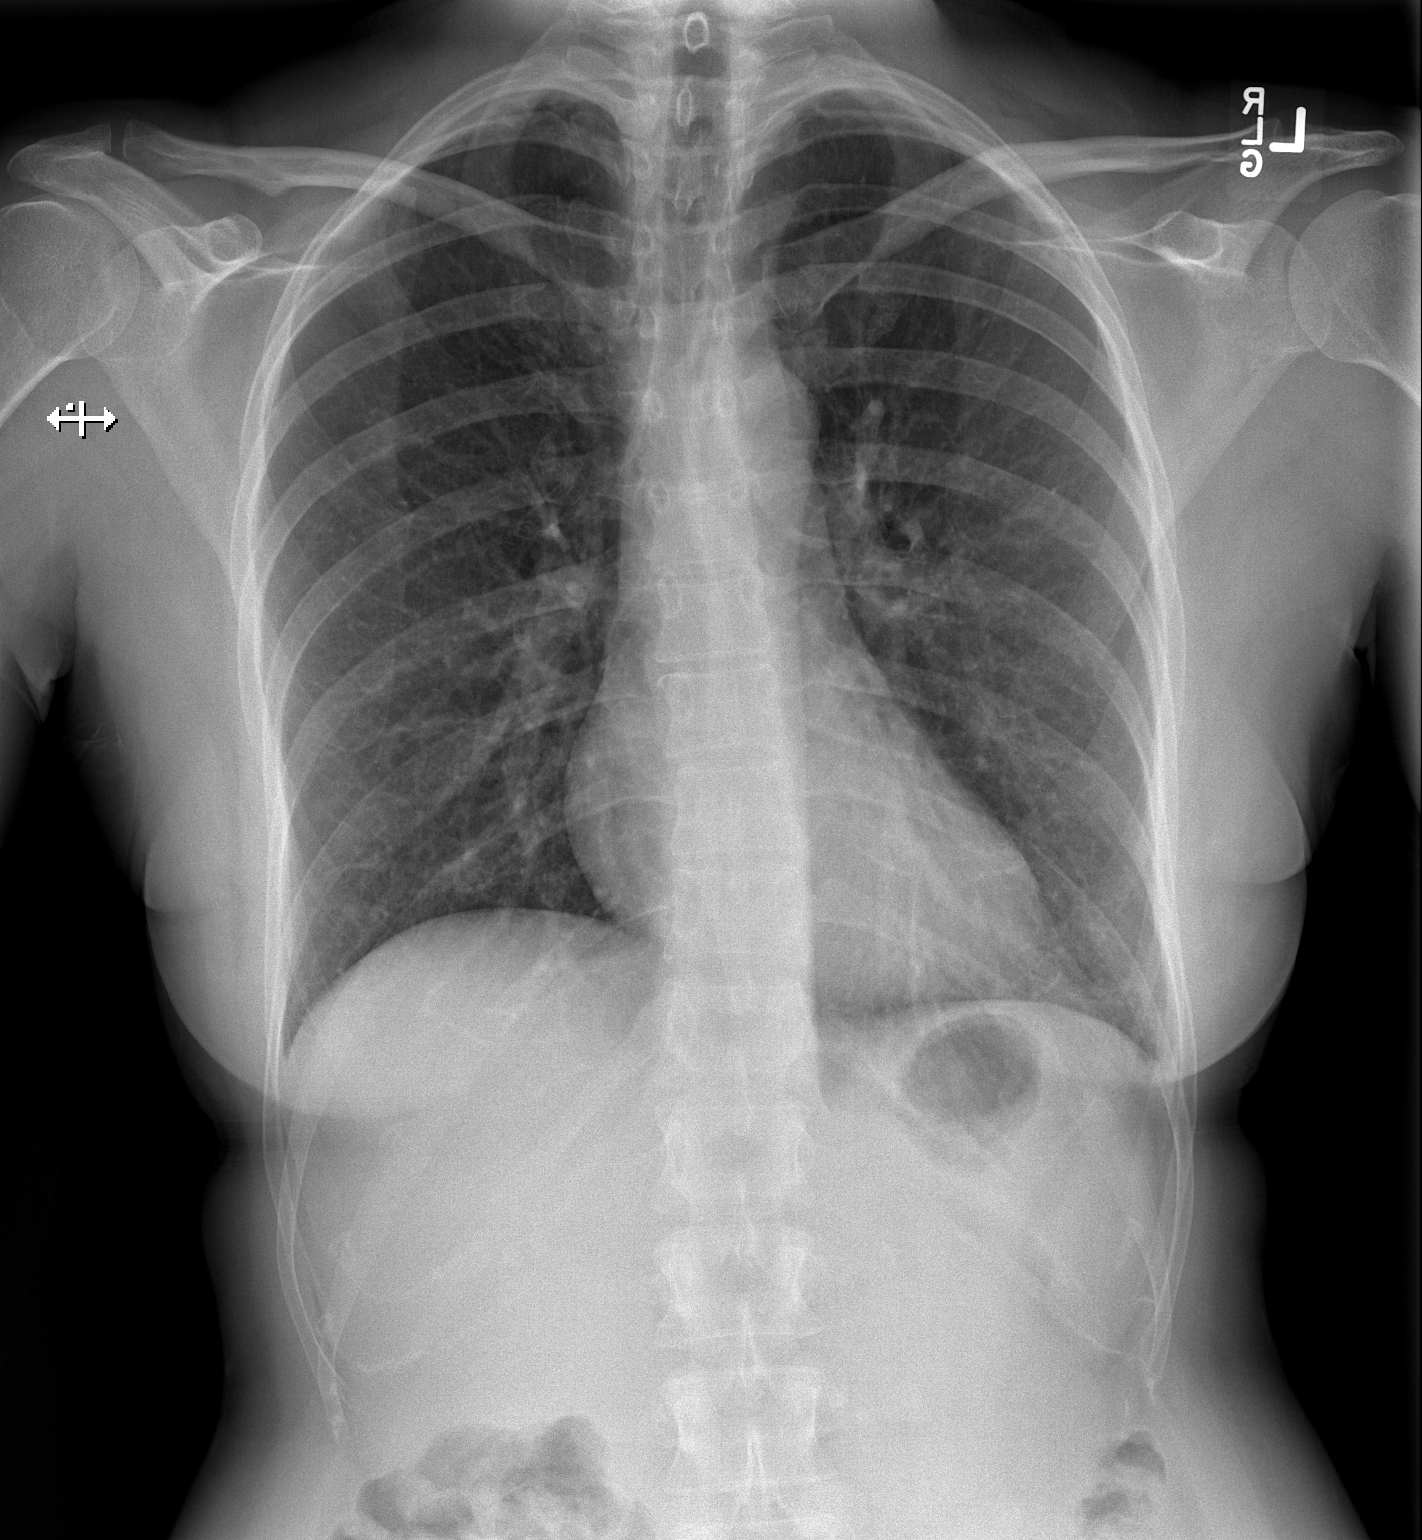

[w chest lat]
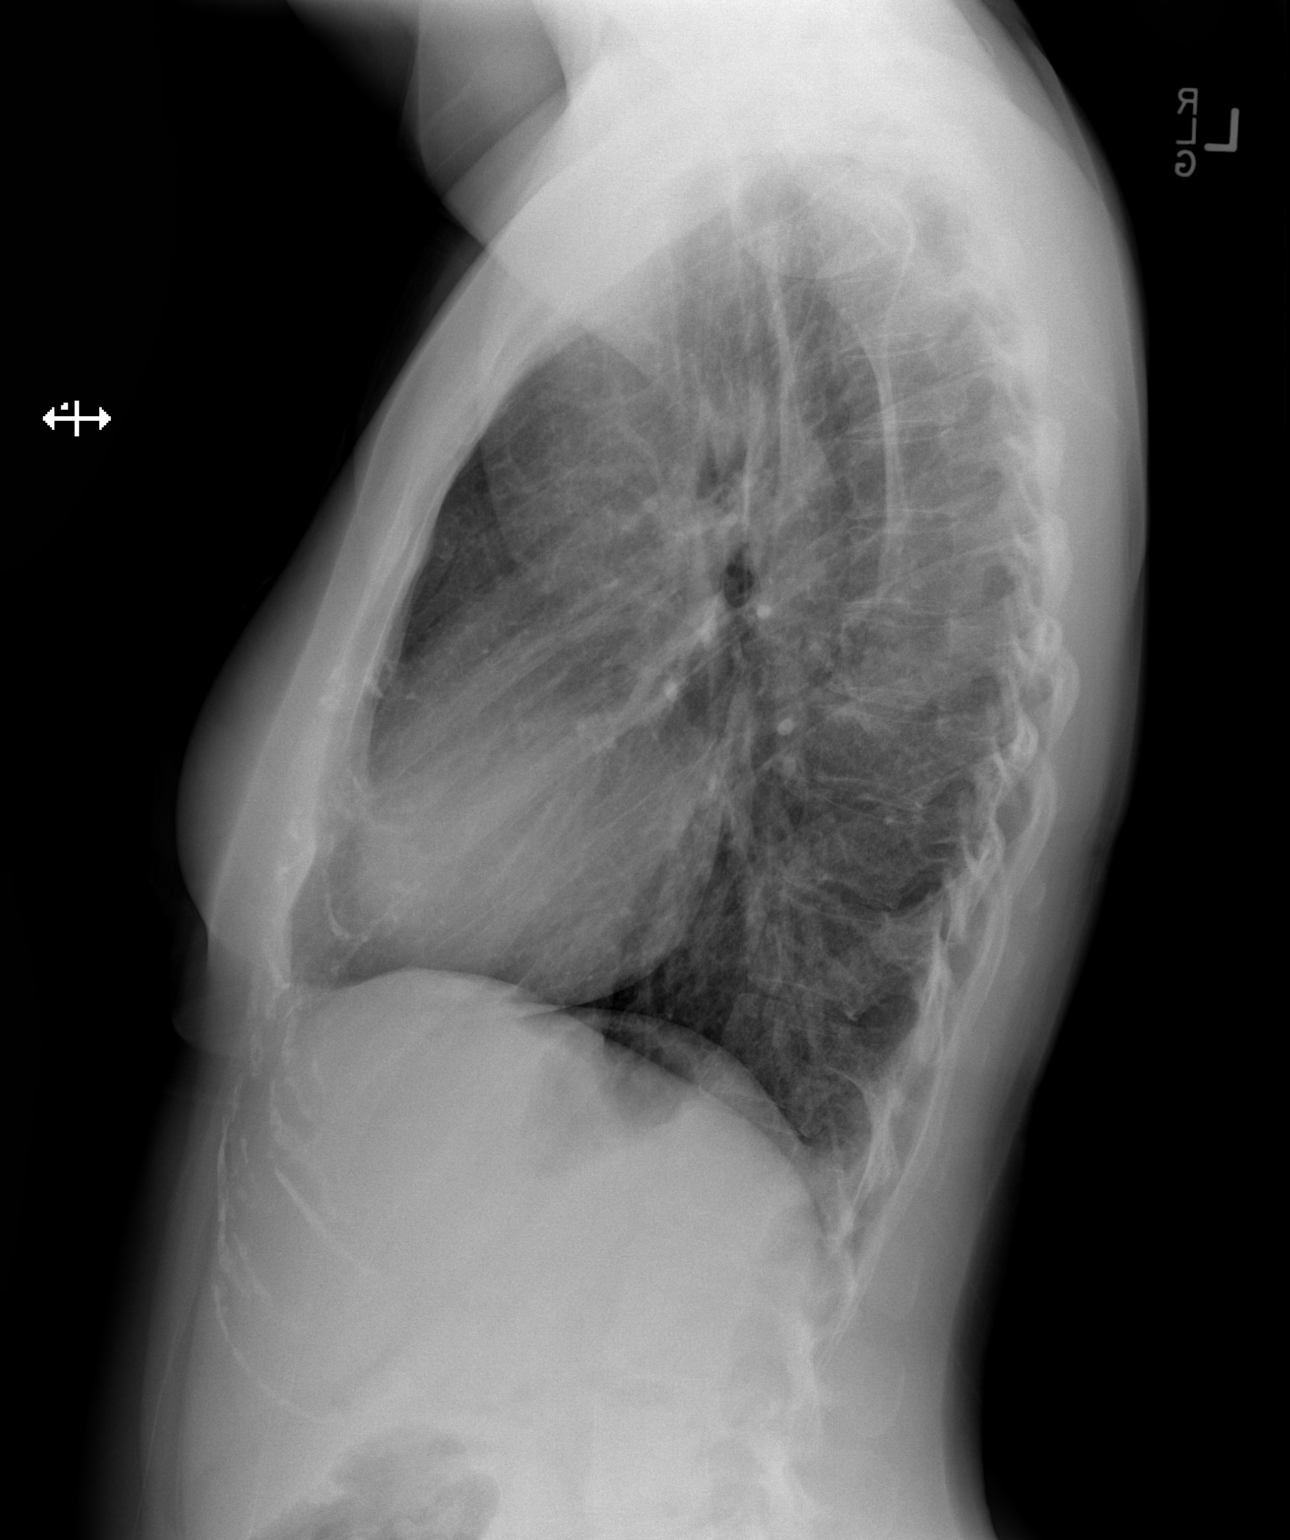

[2 of 2 positions shown; findings below may reference images not displayed]

FINDINGS: Cardiomediastinal silhouette is unremarkable. No acute infiltrate or
pulmonary edema. No gross fractures are identified. There is no
pneumothorax.
IMPRESSION: No active cardiopulmonary disease.

## 2022-02-19 DIAGNOSIS — F419 Anxiety disorder, unspecified: Secondary | ICD-10-CM | POA: Diagnosis not present

## 2022-02-19 DIAGNOSIS — R41840 Attention and concentration deficit: Secondary | ICD-10-CM | POA: Diagnosis not present

## 2022-03-30 DIAGNOSIS — G5622 Lesion of ulnar nerve, left upper limb: Secondary | ICD-10-CM | POA: Diagnosis not present

## 2022-03-30 DIAGNOSIS — S5002XA Contusion of left elbow, initial encounter: Secondary | ICD-10-CM | POA: Diagnosis not present

## 2022-04-15 DIAGNOSIS — M25522 Pain in left elbow: Secondary | ICD-10-CM | POA: Diagnosis not present

## 2022-05-07 DIAGNOSIS — S5002XD Contusion of left elbow, subsequent encounter: Secondary | ICD-10-CM | POA: Diagnosis not present

## 2022-05-14 DIAGNOSIS — Z0000 Encounter for general adult medical examination without abnormal findings: Secondary | ICD-10-CM | POA: Diagnosis not present

## 2022-05-14 DIAGNOSIS — R41840 Attention and concentration deficit: Secondary | ICD-10-CM | POA: Diagnosis not present

## 2022-05-14 DIAGNOSIS — F419 Anxiety disorder, unspecified: Secondary | ICD-10-CM | POA: Diagnosis not present

## 2022-05-14 DIAGNOSIS — M62830 Muscle spasm of back: Secondary | ICD-10-CM | POA: Diagnosis not present

## 2022-07-21 ENCOUNTER — Telehealth: Payer: 59 | Admitting: Emergency Medicine

## 2022-07-21 DIAGNOSIS — J069 Acute upper respiratory infection, unspecified: Secondary | ICD-10-CM

## 2022-07-21 MED ORDER — FLUTICASONE PROPIONATE 50 MCG/ACT NA SUSP: 2.0000 | Freq: Every day | NASAL | 0 refills | Status: AC

## 2022-07-21 MED ORDER — BENZONATATE 100 MG PO CAPS: 100.0000 mg | ORAL_CAPSULE | Freq: Two times a day (BID) | ORAL | 0 refills | Status: AC | PRN

## 2022-07-21 NOTE — Progress Notes (Signed)
E-Visit for Upper Respiratory Infection   We are sorry you are not feeling well.  Here is how we plan to help!  Based on what you have shared with me, it looks like you may have a viral upper respiratory infection.  Upper respiratory infections are caused by a large number of viruses; however, rhinovirus is the most common cause.   Symptoms vary from person to person, with common symptoms including sore throat, cough, fatigue or lack of energy and feeling of general discomfort.  A low-grade fever of up to 100.4 may present, but is often uncommon.  Symptoms vary however, and are closely related to a person's age or underlying illnesses.  The most common symptoms associated with an upper respiratory infection are nasal discharge or congestion, cough, sneezing, headache and pressure in the ears and face.  These symptoms usually persist for about 3 to 10 days, but can last up to 2 weeks.  It is important to know that upper respiratory infections do not cause serious illness or complications in most cases.    Upper respiratory infections can be transmitted from person to person, with the most common method of transmission being a person's hands.  The virus is able to live on the skin and can infect other persons for up to 2 hours after direct contact.  Also, these can be transmitted when someone coughs or sneezes; thus, it is important to cover the mouth to reduce this risk.  To keep the spread of the illness at [place], good hand hygiene is very important.  This is an infection that is most likely caused by a virus. There are no specific treatments other than to help you with the symptoms until the infection runs its course.  We are sorry you are not feeling well.  Here is how we plan to help!   For nasal congestion, you may use an oral decongestants such as Mucinex D or if you have glaucoma or high blood pressure use plain Mucinex.    Saline nasal spray or nasal drops can help and can safely be used as often  as needed for congestion. Try using saline irrigation, such as with a neti pot, several times a day while you are sick. Many neti pots come with salt packets premeasured to use to make saline. If you use your own salt, make sure it is kosher salt or sea salt (don't use table salt as it has iodine in it and you don't need that in your nose). Use distilled water to make saline. If you mix your own saline using your own salt, the recipe is 1/4 teaspoon salt in 1 cup warm water. Using saline irrigation can help prevent and treat sinus infections.    For your congestion, I have prescribed Fluticasone nasal spray one spray in each nostril twice a day  If you do not have a history of heart disease, hypertension, diabetes or thyroid disease, prostate/bladder issues or glaucoma, you may also use Sudafed to treat nasal congestion.  It is highly recommended that you consult with a pharmacist or your primary care physician to ensure this medication is safe for you to take.     If you have a cough, you may use cough suppressants such as Delsym and Robitussin.  If you have glaucoma or high blood pressure, you can also use Coricidin HBP.   For cough I have prescribed for you A prescription cough medication called Tessalon Perles 100 mg. You may take 1-2 capsules every 8  hours as needed for cough  If you have a sore or scratchy throat, use a saltwater gargle-  to  teaspoon of salt dissolved in a 4-ounce to 8-ounce glass of warm water.  Gargle the solution for approximately 15-30 seconds and then spit.  It is important not to swallow the solution.  You can also use throat lozenges/cough drops and Chloraseptic spray to help with throat pain or discomfort.  Warm or cold liquids can also be helpful in relieving throat pain.  For headache, pain or general discomfort, you can use Ibuprofen or Tylenol as directed.   Some authorities believe that zinc sprays or the use of Echinacea may shorten the course of your  symptoms.   HOME CARE Only take medications as instructed by your medical team. Be sure to drink plenty of fluids. Water is fine as well as fruit juices, sodas and electrolyte beverages. You may want to stay away from caffeine or alcohol. If you are nauseated, try taking small sips of liquids. How do you know if you are getting enough fluid? Your urine should be a pale yellow or almost colorless. Get rest. Taking a steamy shower or using a humidifier may help nasal congestion and ease sore throat pain. You can place a towel over your head and breathe in the steam from hot water coming from a faucet. Using a saline nasal spray works much the same way. Cough drops, hard candies and sore throat lozenges may ease your cough. Avoid close contacts especially the very young and the elderly Cover your mouth if you cough or sneeze Always remember to wash your hands.   GET HELP RIGHT AWAY IF: You develop worsening fever. If your symptoms do not improve within 10 days You develop yellow or green discharge from your nose over 3 days. You have coughing fits You develop a severe head ache or visual changes. You develop shortness of breath, difficulty breathing or start having chest pain Your symptoms persist after you have completed your treatment plan  MAKE SURE YOU  Understand these instructions. Will watch your condition. Will get help right away if you are not doing well or get worse.  Thank you for choosing an e-visit.  Your e-visit answers were reviewed by a board certified advanced clinical practitioner to complete your personal care plan. Depending upon the condition, your plan could have included both over the counter or prescription medications.  Please review your pharmacy choice. Make sure the pharmacy is open so you can pick up prescription now. If there is a problem, you may contact your provider through [employer] and have the prescription routed to another pharmacy.  Your  safety is important to [place]. If you have drug allergies check your prescription carefully.   For the next 24 hours you can use MyChart to ask questions about today's visit, request a non-urgent call back, or ask for a work or school excuse. You will get an email in the next two days asking about your experience. I hope that your e-visit has been valuable and will speed your recovery.   I have spent 5 minutes in review of e-visit questionnaire, review and updating patient chart, medical decision making and response to patient.   Willeen Cass, PhD, FNP-BC

## 2022-08-13 DIAGNOSIS — M62830 Muscle spasm of back: Secondary | ICD-10-CM | POA: Diagnosis not present

## 2022-08-13 DIAGNOSIS — F419 Anxiety disorder, unspecified: Secondary | ICD-10-CM | POA: Diagnosis not present

## 2022-08-13 DIAGNOSIS — R41840 Attention and concentration deficit: Secondary | ICD-10-CM | POA: Diagnosis not present

## 2022-09-09 ENCOUNTER — Ambulatory Visit
Admission: RE | Admit: 2022-09-09 | Discharge: 2022-09-09 | Disposition: A | Discharge status: Home / Self Care | Payer: 59 | Source: Ambulatory Visit | Attending: Emergency Medicine | Admitting: Emergency Medicine

## 2022-09-09 VITALS — BP 136/85 | HR 91 | Temp 98.2°F | Resp 17

## 2022-09-09 DIAGNOSIS — L239 Allergic contact dermatitis, unspecified cause: Secondary | ICD-10-CM | POA: Diagnosis not present

## 2022-09-09 MED ORDER — DEXAMETHASONE SODIUM PHOSPHATE 10 MG/ML IJ SOLN
10.0000 mg | Freq: Once | INTRAMUSCULAR | Status: AC
  Administered 2022-09-09: 10 mg via INTRAMUSCULAR

## 2022-09-09 MED ORDER — PREDNISONE 10 MG (21) PO TBPK: ORAL_TABLET | Freq: Every day | ORAL | 0 refills | Status: AC

## 2022-09-09 NOTE — Discharge Instructions (Addendum)
You were given an injection of a steroid called dexamethasone.  Start the prednisone taper tomorrow as directed.    Take Benadryl or Zyrtec as directed.    Follow up with your primary care provider.   Go to the emergency department if you have difficulty swallowing or breathing.    

## 2022-09-09 NOTE — ED Triage Notes (Signed)
Pt states she has a rash on her right arm with red blisters that started Saturday, now having pain in right eye with rash/blisters on face close to right eye. Pain in right eye is sharp shooting with burning sensation. Right eye matted shut this morning with mucus like discharge.

## 2022-09-09 NOTE — ED Provider Notes (Signed)
[name] [name]    CSN: [id number] Arrival date & time: 09/09/22  1233      History   Chief Complaint Chief Complaint  Patient presents with   Eye Problem    Entered by patient    HPI [name] [name] is a 47 y.o. female.  Patient presents with pruritic rash on her right arm and forehead and right cheek x 4 days.  She has been hiking and also working in the yard.  No rash in mouth or eyes.  She reports intermittent right eye pain but no drainage or change in vision.  Treatment attempted with Benadryl at bedtime, allergy eye drops, and prednisone 10 mg daily x 3 days (this was left over from previous illness).  She denies fever, chills, difficulty swallowing, shortness of breath, or other symptoms.    The history is provided by the patient and medical records.    Past Medical History:  Diagnosis Date   Allergic rhinitis, cause unspecified    Anxiety state, unspecified    Depressive disorder, not elsewhere classified    Migraine    Papanicolaou smear of cervix with atypical squamous cells of undetermined significance (ASC-US)    Persistent disorder of initiating or maintaining sleep    Unspecified asthma(493.90)     Patient Active Problem List   Diagnosis Date Noted   Menorrhagia 10/03/2012   Dysmenorrhea 10/03/2012   S/P laparoscopic assisted vaginal hysterectomy (LAVH) 10/03/2012   Skin rash 12/09/2011   COMMON MIGRAINE 07/31/2010   DYSURIA, CHRONIC 05/22/2009   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   INSOMNIA, CHRONIC 07/11/2007   DEPRESSION 07/11/2007   ALLERGIC RHINITIS 07/11/2007    Past Surgical History:  Procedure Laterality Date   BILATERAL SALPINGECTOMY Bilateral 10/03/2012   Procedure: BILATERAL SALPINGECTOMY;  Surgeon: Logan Bores, MD;  Location: [facility];  Service: Gynecology;  Laterality: Bilateral;   ENDOMETRIAL ABLATION     LAPAROSCOPIC ASSISTED VAGINAL HYSTERECTOMY Bilateral 10/03/2012   Procedure: LAPAROSCOPIC ASSISTED VAGINAL HYSTERECTOMY;   Surgeon: Logan Bores, MD;  Location: [facility];  Service: Gynecology;  Laterality: Bilateral;   TUBAL LIGATION      OB History   No obstetric history on file.      Home Medications    Prior to Admission medications   Medication Sig Start Date End Date Taking? Authorizing Provider  albuterol (PROVENTIL,VENTOLIN) 90 MCG/ACT inhaler Inhale 2 puffs into the lungs every 6 (six) hours as needed.     Yes [provider]  ALPRAZolam Duanne Moron) 0.5 MG tablet Take 1 mg by mouth 2 (two) times daily as needed for anxiety.    Yes [provider]  fluticasone (FLONASE) 50 MCG/ACT nasal spray Place 2 sprays into both nostrils daily. 07/21/22  Yes Carvel Getting, NP  ibuprofen (ADVIL,MOTRIN) 600 MG tablet Take 1 tablet (600 mg total) by mouth every 8 (eight) hours as needed. 09/17/15  Yes Jola Schmidt, MD  Multiple Vitamin (MULTIVITAMIN) tablet Take 1 tablet by mouth daily.     Yes [provider]  predniSONE (STERAPRED UNI-PAK 21 TAB) 10 MG (21) TBPK tablet Take by mouth daily. As directed 09/10/22  Yes Sharion Balloon, NP  sertraline (ZOLOFT) 100 MG tablet Take 100 mg by mouth daily.     Yes [provider]  benzonatate (TESSALON) 100 MG capsule Take 1 capsule (100 mg total) by mouth 2 (two) times daily as needed for cough. 07/21/22   Carvel Getting, NP  cyclobenzaprine (FLEXERIL) 10 MG tablet Take 1 tablet (10  mg total) by mouth 3 (three) times daily as needed for muscle spasms. 09/17/15   Jola Schmidt, MD  HYDROcodone-acetaminophen (NORCO/VICODIN) 5-325 MG tablet Take 1 tablet by mouth every 6 (six) hours as needed for moderate pain. 09/17/15   Jola Schmidt, MD  montelukast (SINGULAIR) 10 MG tablet Take 1 tablet (10 mg total) by mouth at bedtime. 10/15/15   Billy Fischer, MD    Family History Family History  Problem Relation Age of Onset   Hypertension Mother    Hyperlipidemia Mother    Crohn's disease Sister    Heart attack Maternal Grandfather 1   Cancer  Paternal Grandmother        ovarian   Cancer Maternal Aunt 85       breast   Cancer Paternal Grandfather        lung    Social History Social History   Tobacco Use   Smoking status: Never  Substance Use Topics   Alcohol use: Yes    Comment: socially   Drug use: No     Allergies   Dilaudid [hydromorphone hcl]   Review of Systems Review of Systems  Constitutional:  Negative for chills and fever.  HENT:  Negative for sore throat and trouble swallowing.   Eyes:  Positive for redness and itching. Negative for pain, discharge and visual disturbance.  Respiratory:  Negative for cough and shortness of breath.   Cardiovascular:  Negative for chest pain and palpitations.  Musculoskeletal:  Negative for arthralgias and joint swelling.  Skin:  Positive for rash.  All other systems reviewed and are negative.    Physical Exam Triage Vital Signs ED Triage Vitals  Enc Vitals Group     BP 09/09/22 1331 136/85     Pulse Rate 09/09/22 1331 91     Resp 09/09/22 1331 17     Temp 09/09/22 1331 98.2 F (36.8 C)     Temp Source 09/09/22 1331 Oral     SpO2 09/09/22 1331 98 %     Weight --      Height --      Head Circumference --      Peak Flow --      Pain Score 09/09/22 1332 4     Pain Loc --      Pain Edu? --      Excl. in [place]? --    No data found.  Updated Vital Signs BP 136/85 (BP Location: Right Arm)   Pulse 91   Temp 98.2 F (36.8 C) (Oral)   Resp 17   LMP 09/24/2012   SpO2 98%   Visual Acuity Right Eye Distance:   Left Eye Distance:   Bilateral Distance:    Right Eye Near:   Left Eye Near:    Bilateral Near:     Physical Exam Vitals and nursing note reviewed.  Constitutional:      General: She is not in acute distress.    Appearance: Normal appearance. She is well-developed. She is not ill-appearing.  HENT:     Head: Normocephalic and atraumatic.     Mouth/Throat:     Mouth: Mucous membranes are moist.     Pharynx: Oropharynx is clear.  Eyes:      General:        Right eye: No discharge.        Left eye: No discharge.     Extraocular Movements: Extraocular movements intact.     Conjunctiva/sclera: Conjunctivae normal.     Pupils: Pupils  are equal, round, and reactive to light.     Comments: Right upper eyelid erythematous. See pictures.   Cardiovascular:     Rate and Rhythm: Normal rate and regular rhythm.     Heart sounds: Normal heart sounds.  Pulmonary:     Effort: Pulmonary effort is normal. No respiratory distress.     Breath sounds: Normal breath sounds.  Musculoskeletal:     Cervical back: Neck supple.  Skin:    General: Skin is warm and dry.     Findings: Rash present.     Comments: Rash on face and right arm.  See pictures.   Neurological:     Mental Status: She is alert.  Psychiatric:        Mood and Affect: Mood normal.        Behavior: Behavior normal.           UC Treatments / Results  Labs (all labs ordered are listed, but only abnormal results are displayed) Labs Reviewed - No data to display  EKG   Radiology No results found.  Procedures Procedures (including critical care time)  Medications Ordered in UC Medications  dexamethasone (DECADRON) injection 10 mg (has no administration in time range)    Initial Impression / Assessment and Plan / UC Course  I have reviewed the triage vital signs and the nursing notes.  Pertinent labs & imaging results that were available during my care of the patient were reviewed by me and considered in my medical decision making (see chart for details).    Allergic dermatitis.  Injection of dexamethasone given here and starting prednisone taper tomorrow.  Discussed antihistamines including Zyrtec or Benadryl.  Education provided on poison ivy dermatitis.  ED precautions discussed.  Instructed patient to follow-up with her PCP if her symptoms are not improving.  She agrees to plan of care.  Final Clinical Impressions(s) / UC Diagnoses   Final diagnoses:   Allergic dermatitis     Discharge Instructions      You were given an injection of a steroid called dexamethasone.  Start the prednisone taper tomorrow as directed.    Take Benadryl or Zyrtec as directed.    Follow up with your primary care provider.   Go to the emergency department if you have difficulty swallowing or breathing.         ED Prescriptions     Medication Sig Dispense Auth. Provider   predniSONE (STERAPRED UNI-PAK 21 TAB) 10 MG (21) TBPK tablet Take by mouth daily. As directed 21 tablet Sharion Balloon, NP      PDMP not reviewed this encounter.   Sharion Balloon, NP 09/09/22 1409

## 2022-09-11 DIAGNOSIS — H1013 Acute atopic conjunctivitis, bilateral: Secondary | ICD-10-CM | POA: Diagnosis not present

## 2022-09-11 DIAGNOSIS — H04123 Dry eye syndrome of bilateral lacrimal glands: Secondary | ICD-10-CM | POA: Diagnosis not present

## 2022-09-18 ENCOUNTER — Ambulatory Visit (HOSPITAL_BASED_OUTPATIENT_CLINIC_OR_DEPARTMENT_OTHER)
Admission: RE | Admit: 2022-09-18 | Discharge: 2022-09-18 | Disposition: A | Discharge status: Home / Self Care | Payer: 59 | Source: Ambulatory Visit | Attending: Obstetrics and Gynecology | Admitting: Obstetrics and Gynecology

## 2022-09-18 ENCOUNTER — Other Ambulatory Visit (HOSPITAL_BASED_OUTPATIENT_CLINIC_OR_DEPARTMENT_OTHER): Payer: Self-pay

## 2022-09-18 DIAGNOSIS — Z1231 Encounter for screening mammogram for malignant neoplasm of breast: Secondary | ICD-10-CM | POA: Diagnosis not present

## 2022-12-14 ENCOUNTER — Other Ambulatory Visit: Payer: Self-pay
